# Patient Record
Sex: Male | Born: 1960 | Race: White | Hispanic: No | Marital: Married | Smoking: Former smoker
Health system: Southern US, Community
[De-identification: ages and names within clinical notes are randomized; demographics above are authoritative.]

## PROBLEM LIST (undated history)

## (undated) DIAGNOSIS — E785 Hyperlipidemia, unspecified: Secondary | ICD-10-CM

## (undated) DIAGNOSIS — G40909 Epilepsy, unspecified, not intractable, without status epilepticus: Secondary | ICD-10-CM

## (undated) HISTORY — PX: KNEE SURGERY: SHX244

## (undated) HISTORY — DX: Epilepsy, unspecified, not intractable, without status epilepticus: G40.909

---

## 1999-01-26 ENCOUNTER — Ambulatory Visit (HOSPITAL_COMMUNITY): Admission: RE | Admit: 1999-01-26 | Discharge: 1999-01-26 | Payer: Self-pay | Admitting: Internal Medicine

## 1999-01-26 ENCOUNTER — Encounter: Payer: Self-pay | Admitting: Internal Medicine

## 1999-12-04 ENCOUNTER — Emergency Department (HOSPITAL_COMMUNITY): Admission: EM | Admit: 1999-12-04 | Discharge: 1999-12-04 | Payer: Self-pay | Admitting: *Deleted

## 2003-03-11 ENCOUNTER — Encounter: Payer: Self-pay | Admitting: Family Medicine

## 2003-03-11 ENCOUNTER — Ambulatory Visit (HOSPITAL_COMMUNITY): Admission: RE | Admit: 2003-03-11 | Discharge: 2003-03-11 | Payer: Self-pay | Admitting: Family Medicine

## 2013-03-19 DIAGNOSIS — G40209 Localization-related (focal) (partial) symptomatic epilepsy and epileptic syndromes with complex partial seizures, not intractable, without status epilepticus: Secondary | ICD-10-CM | POA: Insufficient documentation

## 2013-11-06 ENCOUNTER — Ambulatory Visit: Payer: No Typology Code available for payment source

## 2013-11-06 ENCOUNTER — Ambulatory Visit (INDEPENDENT_AMBULATORY_CARE_PROVIDER_SITE_OTHER): Payer: No Typology Code available for payment source | Admitting: Family Medicine

## 2013-11-06 VITALS — BP 118/74 | HR 76 | Temp 98.1°F | Resp 16 | Ht 72.0 in | Wt 241.0 lb

## 2013-11-06 DIAGNOSIS — R109 Unspecified abdominal pain: Secondary | ICD-10-CM

## 2013-11-06 DIAGNOSIS — IMO0002 Reserved for concepts with insufficient information to code with codable children: Secondary | ICD-10-CM

## 2013-11-06 DIAGNOSIS — S39011A Strain of muscle, fascia and tendon of abdomen, initial encounter: Secondary | ICD-10-CM

## 2013-11-06 DIAGNOSIS — R252 Cramp and spasm: Secondary | ICD-10-CM

## 2013-11-06 DIAGNOSIS — R290 Tetany: Secondary | ICD-10-CM

## 2013-11-06 LAB — POCT UA - MICROSCOPIC ONLY
BACTERIA, U MICROSCOPIC: NEGATIVE
Casts, Ur, LPF, POC: NEGATIVE
Crystals, Ur, HPF, POC: NEGATIVE
Yeast, UA: NEGATIVE

## 2013-11-06 LAB — POCT URINALYSIS DIPSTICK
Bilirubin, UA: NEGATIVE
Blood, UA: NEGATIVE
Glucose, UA: NEGATIVE
Ketones, UA: 15
Leukocytes, UA: NEGATIVE
Nitrite, UA: NEGATIVE
Protein, UA: NEGATIVE
Spec Grav, UA: >=1.03
Urobilinogen, UA: 1
pH, UA: 5.5

## 2013-11-06 LAB — POCT CBC
Granulocyte percent: 49.6 %G (ref 37–80)
HCT, POC: 46.2 % (ref 43.5–53.7)
Hemoglobin: 15 g/dL (ref 14.1–18.1)
Lymph, poc: 3 (ref 0.6–3.4)
MCH, POC: 31.6 pg — AB (ref 27–31.2)
MCHC: 32.5 g/dL (ref 31.8–35.4)
MCV: 97.4 fL — AB (ref 80–97)
MID (cbc): 0.4 (ref 0–0.9)
MPV: 9.1 fL (ref 0–99.8)
POC Granulocyte: 3.4 (ref 2–6.9)
POC LYMPH PERCENT: 44.2 %L (ref 10–50)
POC MID %: 6.2 %M (ref 0–12)
Platelet Count, POC: 276 10*3/uL (ref 142–424)
RBC: 4.74 M/uL (ref 4.69–6.13)
RDW, POC: 13.1 %
WBC: 6.9 10*3/uL (ref 4.6–10.2)

## 2013-11-06 MED ORDER — CYCLOBENZAPRINE HCL 10 MG PO TABS: 10.0000 mg | ORAL_TABLET | Freq: Three times a day (TID) | ORAL | Status: DC | PRN

## 2013-11-06 MED ORDER — DICLOFENAC SODIUM 75 MG PO TBEC: 75.0000 mg | DELAYED_RELEASE_TABLET | Freq: Two times a day (BID) | ORAL | Status: DC

## 2013-11-06 NOTE — Patient Instructions (Signed)
Take the diclofenac one twice a day for pain and inflammation  Take the cyclobenzaprine one at bedtime for muscle accident. When you're not working you can take one half or one in the morning and in the afternoon if needed. It probably makes her too drowsy to make you want to work with it in your system  Hydrate better  A light you know the results of your labs  If symptoms persist we will need to do other studies and/or refer you for physical therapy..Marland Kitchen

## 2013-11-06 NOTE — Progress Notes (Signed)
Subjective: Patient has been having pain in his flank off and on for the past year. Sometimes it hurts worse, sometimes less. It does seem to be somewhat activity related. Knows of no initial injury. It is hurt a lot yesterday and today. No nausea vomiting. Assessment chronic right quadrant tenderness. He does have an intermittent burning in his epigastrium which he's had for many years.  Generally he is healthy. He has not been exercising as much over the last years he used to. He has gradually gained 30 pounds over the recent years.  Patient does complain of occasionally getting carpal spasm in his hands. Apparently it happens when he is dehydrated, and he does not drink enough water. We talked about that. Objective: No acute distress. No CVA tenderness. Chest clear. Abdomen soft without masses or tenderness.    Results for orders placed in visit on 11/06/13  POCT CBC      Result Value Ref Range   WBC 6.9  4.6 - 10.2 K/uL   Lymph, poc 3.0  0.6 - 3.4   POC LYMPH PERCENT 44.2  10 - 50 %L   MID (cbc) 0.4  0 - 0.9   POC MID % 6.2  0 - 12 %M   POC Granulocyte 3.4  2 - 6.9   Granulocyte percent 49.6  37 - 80 %G   RBC 4.74  4.69 - 6.13 M/uL   Hemoglobin 15.0  14.1 - 18.1 g/dL   HCT, POC 40.946.2  81.143.5 - 53.7 %   MCV 97.4 (*) 80 - 97 fL   MCH, POC 31.6 (*) 27 - 31.2 pg   MCHC 32.5  31.8 - 35.4 g/dL   RDW, POC 91.413.1     Platelet Count, POC 276  142 - 424 K/uL   MPV 9.1  0 - 99.8 fL  POCT URINALYSIS DIPSTICK      Result Value Ref Range   Color, UA yellow     Clarity, UA clear     Glucose, UA neg     Bilirubin, UA neg     Ketones, UA 15     Spec Grav, UA >=1.030     Blood, UA neg     pH, UA 5.5     Protein, UA neg     Urobilinogen, UA 1.0     Nitrite, UA neg     Leukocytes, UA Negative    POCT UA - MICROSCOPIC ONLY      Result Value Ref Range   WBC, Ur, HPF, POC 0-1     RBC, urine, microscopic 0-1     Bacteria, U Microscopic neg     Mucus, UA small     Epithelial cells, urine per  micros 0-1     Crystals, Ur, HPF, POC neg     Casts, Ur, LPF, POC neg     Yeast, UA neg     [facility] reading (PRIMARY) by  Dr. Alwyn RenHopper Normal abdomen.  Assessment: Right flank pain etiology and known, probable muscle strain Intermittent carpal spasm

## 2013-11-07 LAB — COMPREHENSIVE METABOLIC PANEL
ALT: 19 U/L (ref 0–53)
AST: 21 U/L (ref 0–37)
Albumin: 4.4 g/dL (ref 3.5–5.2)
Alkaline Phosphatase: 64 U/L (ref 39–117)
BUN: 19 mg/dL (ref 6–23)
CO2: 25 mEq/L (ref 19–32)
Calcium: 9.4 mg/dL (ref 8.4–10.5)
Chloride: 101 mEq/L (ref 96–112)
Creat: 1.35 mg/dL (ref 0.50–1.35)
Glucose, Bld: 92 mg/dL (ref 70–99)
Potassium: 4.5 mEq/L (ref 3.5–5.3)
Sodium: 140 mEq/L (ref 135–145)
Total Bilirubin: 0.3 mg/dL (ref 0.2–1.2)
Total Protein: 7.3 g/dL (ref 6.0–8.3)

## 2013-11-08 ENCOUNTER — Other Ambulatory Visit: Payer: Self-pay | Admitting: Family Medicine

## 2013-11-09 ENCOUNTER — Encounter: Payer: Self-pay | Admitting: *Deleted

## 2019-09-04 ENCOUNTER — Other Ambulatory Visit: Payer: Self-pay | Admitting: Urology

## 2019-09-04 DIAGNOSIS — N218 Other lower urinary tract calculus: Secondary | ICD-10-CM

## 2019-09-09 ENCOUNTER — Other Ambulatory Visit: Payer: Self-pay | Admitting: Urology

## 2019-09-09 DIAGNOSIS — N218 Other lower urinary tract calculus: Secondary | ICD-10-CM

## 2019-09-09 DIAGNOSIS — R109 Unspecified abdominal pain: Secondary | ICD-10-CM

## 2019-09-09 DIAGNOSIS — R310 Gross hematuria: Secondary | ICD-10-CM

## 2019-09-11 ENCOUNTER — Ambulatory Visit
Admission: RE | Admit: 2019-09-11 | Discharge: 2019-09-11 | Disposition: A | Discharge status: Home / Self Care | Payer: BC Managed Care – PPO | Source: Ambulatory Visit | Attending: Urology | Admitting: Urology

## 2019-09-11 DIAGNOSIS — N218 Other lower urinary tract calculus: Secondary | ICD-10-CM

## 2019-09-11 DIAGNOSIS — R109 Unspecified abdominal pain: Secondary | ICD-10-CM

## 2019-09-11 DIAGNOSIS — R310 Gross hematuria: Secondary | ICD-10-CM

## 2019-09-11 MED ORDER — IOPAMIDOL (ISOVUE-300) INJECTION 61%
125.0000 mL | Freq: Once | INTRAVENOUS | Status: AC | PRN
  Administered 2019-09-11: 13:00:00 125 mL via INTRAVENOUS

## 2019-11-07 ENCOUNTER — Inpatient Hospital Stay (HOSPITAL_COMMUNITY)
Admission: EM | Admit: 2019-11-07 | Discharge: 2019-11-09 | DRG: 177 | Disposition: A | Discharge status: Home / Self Care | Payer: BC Managed Care – PPO | Attending: Internal Medicine | Admitting: Internal Medicine

## 2019-11-07 ENCOUNTER — Encounter (HOSPITAL_COMMUNITY): Payer: Self-pay | Admitting: Emergency Medicine

## 2019-11-07 ENCOUNTER — Emergency Department (HOSPITAL_COMMUNITY): Payer: BC Managed Care – PPO

## 2019-11-07 ENCOUNTER — Other Ambulatory Visit: Payer: Self-pay

## 2019-11-07 DIAGNOSIS — Z8249 Family history of ischemic heart disease and other diseases of the circulatory system: Secondary | ICD-10-CM

## 2019-11-07 DIAGNOSIS — R059 Cough, unspecified: Secondary | ICD-10-CM

## 2019-11-07 DIAGNOSIS — U071 COVID-19: Principal | ICD-10-CM | POA: Diagnosis present

## 2019-11-07 DIAGNOSIS — R55 Syncope and collapse: Secondary | ICD-10-CM | POA: Diagnosis present

## 2019-11-07 DIAGNOSIS — R6883 Chills (without fever): Secondary | ICD-10-CM

## 2019-11-07 DIAGNOSIS — J1282 Pneumonia due to coronavirus disease 2019: Secondary | ICD-10-CM | POA: Diagnosis present

## 2019-11-07 DIAGNOSIS — Z87891 Personal history of nicotine dependence: Secondary | ICD-10-CM

## 2019-11-07 DIAGNOSIS — G40909 Epilepsy, unspecified, not intractable, without status epilepticus: Secondary | ICD-10-CM | POA: Diagnosis present

## 2019-11-07 DIAGNOSIS — R0602 Shortness of breath: Secondary | ICD-10-CM | POA: Diagnosis not present

## 2019-11-07 DIAGNOSIS — Z83438 Family history of other disorder of lipoprotein metabolism and other lipidemia: Secondary | ICD-10-CM

## 2019-11-07 DIAGNOSIS — R05 Cough: Secondary | ICD-10-CM

## 2019-11-07 DIAGNOSIS — J189 Pneumonia, unspecified organism: Secondary | ICD-10-CM

## 2019-11-07 DIAGNOSIS — R079 Chest pain, unspecified: Secondary | ICD-10-CM

## 2019-11-07 LAB — POCT I-STAT 7, (LYTES, BLD GAS, ICA,H+H)
Bicarbonate: 24.1 mmol/L (ref 20.0–28.0)
Calcium, Ion: 1.19 mmol/L (ref 1.15–1.40)
HCT: 38 % — ABNORMAL LOW (ref 39.0–52.0)
Hemoglobin: 12.9 g/dL — ABNORMAL LOW (ref 13.0–17.0)
O2 Saturation: 94 %
Patient temperature: 99.5
Potassium: 3.8 mmol/L (ref 3.5–5.1)
Sodium: 139 mmol/L (ref 135–145)
TCO2: 25 mmol/L (ref 22–32)
pCO2 arterial: 37.1 mmHg (ref 32.0–48.0)
pH, Arterial: 7.424 (ref 7.350–7.450)
pO2, Arterial: 69 mmHg — ABNORMAL LOW (ref 83.0–108.0)

## 2019-11-07 LAB — BRAIN NATRIURETIC PEPTIDE: B Natriuretic Peptide: 18.6 pg/mL (ref 0.0–100.0)

## 2019-11-07 LAB — C-REACTIVE PROTEIN: CRP: 1.7 mg/dL — ABNORMAL HIGH (ref <1.0)

## 2019-11-07 LAB — BASIC METABOLIC PANEL
Anion gap: 11 (ref 5–15)
BUN: 14 mg/dL (ref 6–20)
CO2: 24 mmol/L (ref 22–32)
Calcium: 9 mg/dL (ref 8.9–10.3)
Chloride: 105 mmol/L (ref 98–111)
Creatinine, Ser: 1.05 mg/dL (ref 0.61–1.24)
GFR calc Af Amer: >60 mL/min (ref >60)
GFR calc non Af Amer: >60 mL/min (ref >60)
Glucose, Bld: 96 mg/dL (ref 70–99)
Potassium: 4.3 mmol/L (ref 3.5–5.1)
Sodium: 140 mmol/L (ref 135–145)

## 2019-11-07 LAB — CBC
HCT: 47 % (ref 39.0–52.0)
Hemoglobin: 15.1 g/dL (ref 13.0–17.0)
MCH: 31.7 pg (ref 26.0–34.0)
MCHC: 32.1 g/dL (ref 30.0–36.0)
MCV: 98.7 fL (ref 80.0–100.0)
Platelets: 94 10*3/uL — ABNORMAL LOW (ref 150–400)
RBC: 4.76 MIL/uL (ref 4.22–5.81)
RDW: 12.5 % (ref 11.5–15.5)
WBC: 3.3 10*3/uL — ABNORMAL LOW (ref 4.0–10.5)
nRBC: 0 % (ref 0.0–0.2)

## 2019-11-07 LAB — MAGNESIUM: Magnesium: 1.7 mg/dL (ref 1.7–2.4)

## 2019-11-07 LAB — LACTIC ACID, PLASMA: Lactic Acid, Venous: 1 mmol/L (ref 0.5–1.9)

## 2019-11-07 LAB — HEPATIC FUNCTION PANEL
ALT: 34 U/L (ref 0–44)
AST: 33 U/L (ref 15–41)
Albumin: 3.2 g/dL — ABNORMAL LOW (ref 3.5–5.0)
Alkaline Phosphatase: 60 U/L (ref 38–126)
Bilirubin, Direct: 0.1 mg/dL (ref 0.0–0.2)
Indirect Bilirubin: 0.3 mg/dL (ref 0.3–0.9)
Total Bilirubin: 0.4 mg/dL (ref 0.3–1.2)
Total Protein: 6.8 g/dL (ref 6.5–8.1)

## 2019-11-07 LAB — D-DIMER, QUANTITATIVE: D-Dimer, Quant: 0.65 ug/mL-FEU — ABNORMAL HIGH (ref 0.00–0.50)

## 2019-11-07 LAB — LACTATE DEHYDROGENASE: LDH: 230 U/L — ABNORMAL HIGH (ref 98–192)

## 2019-11-07 LAB — POC SARS CORONAVIRUS 2 AG -  ED: SARS Coronavirus 2 Ag: POSITIVE — AB

## 2019-11-07 LAB — LIPASE, BLOOD: Lipase: 41 U/L (ref 11–51)

## 2019-11-07 LAB — TROPONIN I (HIGH SENSITIVITY)
Troponin I (High Sensitivity): 6 ng/L (ref <18)
Troponin I (High Sensitivity): 7 ng/L (ref <18)

## 2019-11-07 MED ORDER — SODIUM CHLORIDE 0.9 % IV SOLN: 1.0000 g | Freq: Once | INTRAVENOUS | Status: DC

## 2019-11-07 MED ORDER — SODIUM CHLORIDE 0.9 % IV SOLN: 500.0000 mg | Freq: Once | INTRAVENOUS | Status: DC

## 2019-11-07 MED ORDER — IOHEXOL 350 MG/ML SOLN
100.0000 mL | Freq: Once | INTRAVENOUS | Status: AC | PRN
  Administered 2019-11-07: 100 mL via INTRAVENOUS

## 2019-11-07 MED ORDER — SODIUM CHLORIDE 0.9% FLUSH
3.0000 mL | Freq: Once | INTRAVENOUS | Status: AC
  Administered 2019-11-07: 3 mL via INTRAVENOUS

## 2019-11-07 MED ORDER — SODIUM CHLORIDE 0.9 % IV BOLUS
1000.0000 mL | Freq: Once | INTRAVENOUS | Status: AC
  Administered 2019-11-07: 1000 mL via INTRAVENOUS

## 2019-11-07 NOTE — ED Notes (Signed)
Pt ambulated on his room with steady gait, SPO2 96-98% on RA while ambulating with HR 110, pt states he feels very dizzy on ambulation.

## 2019-11-07 NOTE — ED Notes (Signed)
Only one set of Blood cx gotten, attempted to get second set twice no success. Kurin device used with aseptic measures, pt tolerated procedure well.

## 2019-11-07 NOTE — ED Notes (Signed)
Brett Rhodes, wife, [phone] would like an update when available

## 2019-11-07 NOTE — ED Provider Notes (Signed)
[facility] Provider Note   CSN: [id number] Arrival date & time: 11/07/19  1359     History Chief Complaint  Brett Rhodes presents with  . Chest Pain    Brett Rhodes is a 59 y.o. male.  The history is provided by the Brett Rhodes and medical records. No language interpreter was used.  Shortness of Breath Severity:  Severe Onset quality:  Gradual Duration:  3 days Timing:  Constant Progression:  Waxing and waning Chronicity:  New Relieved by:  Nothing Worsened by:  Nothing Ineffective treatments:  None tried Associated symptoms: chest pain, cough and sputum production   Associated symptoms: no abdominal pain, no claudication, no diaphoresis, no fever, no headaches, no neck pain, no rash, no vomiting and no wheezing        Past Medical History:  Diagnosis Date  . Epilepsy (HCC)     There are no problems to display for this Brett Rhodes.   Past Surgical History:  Procedure Laterality Date  . KNEE SURGERY         Family History  Problem Relation Age of Onset  . Hypertension Mother   . Hyperlipidemia Mother   . Hyperlipidemia Father     Social History   Tobacco Use  . Smoking status: Former Games developermoker  . Smokeless tobacco: Never Used  Substance Use Topics  . Alcohol use: Not Currently  . Drug use: Not Currently    Home Medications Prior to Admission medications   Medication Sig Start Date End Date Taking? Authorizing Provider  cyclobenzaprine (FLEXERIL) 10 MG tablet Take 1 tablet (10 mg total) by mouth 3 (three) times daily as needed for muscle spasms. 11/06/13   Peyton NajjarHopper, David H, MD  diclofenac (VOLTAREN) 75 MG EC tablet Take 1 tablet (75 mg total) by mouth 2 (two) times daily. 11/06/13   Peyton NajjarHopper, David H, MD  divalproex (DEPAKOTE ER) 500 MG 24 hr tablet Take 500 mg by mouth daily.    [provider]    Allergies    Brett Rhodes has no known allergies.  Review of Systems   Review of Systems  Constitutional: Positive  for chills and fatigue. Negative for diaphoresis and fever.  HENT: Positive for congestion.   Eyes: Negative for visual disturbance.  Respiratory: Positive for cough, sputum production, chest tightness and shortness of breath. Negative for wheezing and stridor.   Cardiovascular: Positive for chest pain. Negative for palpitations, claudication and leg swelling.  Gastrointestinal: Negative for abdominal pain, constipation, diarrhea, nausea and vomiting.  Genitourinary: Negative for flank pain.  Musculoskeletal: Negative for back pain, neck pain and neck stiffness.  Skin: Negative for rash and wound.  Neurological: Negative for dizziness, light-headedness, numbness and headaches.  Psychiatric/Behavioral: Negative for agitation and confusion.  All other systems reviewed and are negative.   Physical Exam Updated Vital Signs BP (!) 166/91   Pulse 83   Temp 99 F (37.2 C) (Oral)   Resp 16   Ht 6\' 1"  (1.854 m)   Wt 111.1 kg   SpO2 96%   BMI 32.32 kg/m   Physical Exam Vitals and nursing note reviewed.  Constitutional:      General: Brett Rhodes is not in acute distress.    Appearance: Brett Rhodes is well-developed. Brett Rhodes is not toxic-appearing or diaphoretic.  HENT:     Head: Normocephalic and atraumatic.  Eyes:     Extraocular Movements: Extraocular movements intact.     Conjunctiva/sclera: Conjunctivae normal.     Pupils: Pupils are equal, round, and reactive  to light.  Cardiovascular:     Rate and Rhythm: Normal rate and regular rhythm.     Heart sounds: No murmur.  Pulmonary:     Effort: Pulmonary effort is normal. Tachypnea present. No respiratory distress.     Breath sounds: Rhonchi present. No wheezing or rales.  Abdominal:     Palpations: Abdomen is soft.     Tenderness: There is no abdominal tenderness.  Musculoskeletal:     Cervical back: Neck supple.     Right lower leg: No tenderness. No edema.     Left lower leg: No tenderness. No edema.  Skin:    General: Skin is warm and dry.    Neurological:     General: No focal deficit present.     Mental Status: Brett Rhodes is alert and oriented to person, place, and time.  Psychiatric:        Mood and Affect: Mood normal.     ED Results / Procedures / Treatments   Labs (all labs ordered are listed, but only abnormal results are displayed) Labs Reviewed  CBC - Abnormal; Notable for the following components:      Result Value   WBC 3.3 (*)    Platelets 94 (*)    All other components within normal limits  HEPATIC FUNCTION PANEL - Abnormal; Notable for the following components:   Albumin 3.2 (*)    All other components within normal limits  C-REACTIVE PROTEIN - Abnormal; Notable for the following components:   CRP 1.7 (*)    All other components within normal limits  LACTATE DEHYDROGENASE - Abnormal; Notable for the following components:   LDH 230 (*)    All other components within normal limits  D-DIMER, QUANTITATIVE (NOT AT [facility]) - Abnormal; Notable for the following components:   D-Dimer, Quant 0.65 (*)    All other components within normal limits  POC SARS CORONAVIRUS 2 AG -  ED - Abnormal; Notable for the following components:   SARS Coronavirus 2 Ag POSITIVE (*)    All other components within normal limits  POCT I-STAT 7, (LYTES, BLD GAS, ICA,H+H) - Abnormal; Notable for the following components:   pO2, Arterial 69.0 (*)    HCT 38.0 (*)    Hemoglobin 12.9 (*)    All other components within normal limits  SARS CORONAVIRUS 2 (TAT 6-24 HRS)  CULTURE, BLOOD (ROUTINE X 2)  CULTURE, BLOOD (ROUTINE X 2)  BASIC METABOLIC PANEL  BRAIN NATRIURETIC PEPTIDE  MAGNESIUM  LIPASE, BLOOD  LACTIC ACID, PLASMA  BLOOD GAS, ARTERIAL  TROPONIN I (HIGH SENSITIVITY)  TROPONIN I (HIGH SENSITIVITY)    EKG EKG Interpretation  Date/Time:  Saturday November 07 2019 14:04:26 EDT Ventricular Rate:  98 PR Interval:  144 QRS Duration: 92 QT Interval:  340 QTC Calculation: 434 R Axis:   -28 Text Interpretation: Normal sinus rhythm  Incomplete right bundle branch block Borderline ECG No prior ECG for comparison. S1Q3T3 pattern. No STEMI Confirmed by Theda Belfast ([id number]) on 11/07/2019 5:50:07 PM   Radiology DG Chest 2 View  Result Date: 11/07/2019 CLINICAL DATA:  Shortness of breath. EXAM: CHEST - 2 VIEW COMPARISON:  None. FINDINGS: The heart size and mediastinal contours are within normal limits. No pneumothorax or pleural effusion is noted. Right lung is clear. Ill-defined left upper lobe density is noted which may represent focal inflammation, but neoplasm malignancy cannot be excluded. The visualized skeletal structures are unremarkable. IMPRESSION: Ill-defined left upper lobe density is noted which may represent focal inflammation, but  neoplasm cannot be excluded. CT scan of the chest is recommended for further evaluation. Electronically Signed   By: Lupita Raider M.D.   On: 11/07/2019 14:45   CT Angio Chest PE W and/or Wo Contrast  Result Date: 11/07/2019 CLINICAL DATA:  Chest pain and dyspnea. Abnormal chest radiograph. EXAM: CT ANGIOGRAPHY CHEST WITH CONTRAST TECHNIQUE: Multidetector CT imaging of the chest was performed using the standard protocol during bolus administration of intravenous contrast. Multiplanar CT image reconstructions and MIPs were obtained to evaluate the vascular anatomy. CONTRAST:  OMNIPAQUE IOHEXOL 350 MG/ML SOLN COMPARISON:  Chest radiograph from earlier today. FINDINGS: Cardiovascular: The study is moderate quality for the evaluation of pulmonary embolism, with mild motion degradation and suboptimal bolus opacification. There are no filling defects in the central, lobar, segmental or subsegmental pulmonary artery branches to suggest acute pulmonary embolism. Great vessels are normal in course and caliber. Normal heart size. No significant pericardial fluid/thickening. Mediastinum/Nodes: No discrete thyroid nodules. Unremarkable esophagus. No pathologically enlarged axillary, mediastinal or  hilar lymph nodes. Lungs/Pleura: No pneumothorax. No pleural effusion. Widespread patchy poorly marginated consolidative and ground-glass nodules throughout both lungs, for example measuring 2.8 cm in the left lower lobe (series 7/image 58), 1.9 cm in the apical right upper lobe (series 7/image 20) and 3.8 cm in the left upper lobe (series 7/image 33). Upper abdomen: No acute abnormality. Musculoskeletal: No aggressive appearing focal osseous lesions. Moderate thoracic spondylosis. Review of the MIP images confirms the above findings. IMPRESSION: 1. No evidence of pulmonary embolism. 2. Widespread patchy poorly marginated consolidative and ground-glass nodules throughout both lungs. Atypical pneumonia suspected, with differential including COVID-19 pneumonia or cryptogenic organizing pneumonia. Recommend follow-up chest CT in 3 months to document resolution / exclude neoplasm. Electronically Signed   By: Delbert Phenix M.D.   On: 11/07/2019 18:45    Procedures Procedures (including critical care time)  CRITICAL CARE Performed by: Canary Brim Julus Kelley Total critical care time: 35 minutes Critical care time was exclusive of separately billable procedures and treating other patients. Critical care was necessary to treat or prevent imminent or life-threatening deterioration. Critical care was time spent personally by me on the following activities: development of treatment plan with Brett Rhodes and/or surrogate as well as nursing, discussions with consultants, evaluation of Brett Rhodes's response to treatment, examination of Brett Rhodes, obtaining history from Brett Rhodes or surrogate, ordering and performing treatments and interventions, ordering and review of laboratory studies, ordering and review of radiographic studies, pulse oximetry and re-evaluation of Brett Rhodes's condition.  Brett Rhodes was evaluated in Emergency Department on 11/08/2019 for the symptoms described in the history of present illness. Brett Rhodes was  evaluated in the context of the global COVID-19 pandemic, which necessitated consideration that the Brett Rhodes might be at risk for infection with the SARS-CoV-2 virus that causes COVID-19. Institutional protocols and algorithms that pertain to the evaluation of patients at risk for COVID-19 are in a state of rapid change based on information released by regulatory bodies including the CDC and federal and state organizations. These policies and algorithms were followed during the Brett Rhodes's care in the ED.  Medications Ordered in ED Medications  divalproex (DEPAKOTE) DR tablet 1,000 mg (1,000 mg Oral Given 11/08/19 0108)  dexamethasone (DECADRON) injection 6 mg (6 mg Intravenous Given 11/08/19 0106)  albuterol (VENTOLIN HFA) 108 (90 Base) MCG/ACT inhaler 2 puff (has no administration in time range)  lactated ringers infusion ( Intravenous New Bag/Given 11/08/19 0107)  remdesivir 200 mg in sodium chloride 0.9% 250 mL IVPB (has  no administration in time range)  remdesivir 100 mg in sodium chloride 0.9 % 100 mL IVPB (has no administration in time range)  sodium chloride flush (NS) 0.9 % injection 3 mL (3 mLs Intravenous Given 11/07/19 1729)  sodium chloride 0.9 % bolus 1,000 mL (0 mLs Intravenous Stopped 11/07/19 1900)  iohexol (OMNIPAQUE) 350 MG/ML injection 100 mL (100 mLs Intravenous Contrast Given 11/07/19 1815)    ED Course  I have reviewed the triage vital signs and the nursing notes.  Pertinent labs & imaging results that were available during my care of the Brett Rhodes were reviewed by me and considered in my medical decision making (see chart for details).    Brett Rhodes                      MARQUIZE SEIB is a 59 y.o. male with a past medical history significant for epilepsy who presents with chest pain, shortness of breath, cough, chills, and near syncope/lightheadedness.  Brett Rhodes reports that Brett Rhodes received the [place] vaccine approximately 9 days ago and is concerned  about chest pain or shortness of breath Brett Rhodes is experiencing.  Brett Rhodes reports Brett Rhodes had some subjective fevers and chills but has not taken temperature at home.  Brett Rhodes was going to have his son taken for similar symptoms to get evaluated with a coronavirus test but when Brett Rhodes started having worsening chest pain and shortness of breath Brett Rhodes want to come in to make sure Brett Rhodes did not have a pulmonary embolism given the concern in the community for thromboembolic disease with the [employer] shot.  Brett Rhodes says Brett Rhodes is having exertional and pleuritic chest discomfort and shortness of breath and is having cough.  Brett Rhodes denies nausea, vomiting, constipation, or diarrhea.  No recent leg pain or leg swelling.  No history of DVT or PE.  On exam, Brett Rhodes does have some rhonchi.  His chest was nontender and abdomen is nontender.  Normal sensation and strength in extremities.  Good pulses in all extremities.  Brett Rhodes did get tachycardic when Brett Rhodes tried to stand and was very short of breath and lightheaded.  Brett Rhodes was near syncopal when Brett Rhodes sat up to let me listen to his lungs.  EKG showed no STEMI.  Chest x-ray showed abnormality and possible pneumonia.  CT was recommended.  Due to the concern for pulmonary embolism with a pleuritic chest pain, shortness of breath, and recent Covid shot, Brett Rhodes had a PE study ordered.  CT PE study shows no pulmonary ballismus though shows multifocal pneumonia concerning for atypical infection.  Covid test was ordered and returned positive.  Initially, antibiotics were ordered due to the possible multifocal pneumonia and due to his continued shortness of breath, chest discomfort, and worsened symptoms with ambulation with tachycardia, we will admit him for further management.  Brett Rhodes placed on oxygen and will be admitted.  Brett Rhodes admitted for shortness of breath and chest discomfort in the setting of COVID-19 infection and possible other pneumonia.     Final Clinical Impression(s) / ED Diagnoses Final  diagnoses:  Chest pain, unspecified type  Cough  Chills  Shortness of breath  Near syncope  Multifocal pneumonia     Clinical Impression: 1. Multifocal pneumonia   2. Chest pain, unspecified type   3. Cough   4. Chills   5. Shortness of breath   6. Near syncope     Disposition: Admit  This note was prepared with assistance of Systems analyst.  Occasional wrong-word or sound-a-like substitutions may have occurred due to the inherent limitations of voice recognition software.     Dnaiel Voller, Canary Brim, MD 11/08/19 [phone]

## 2019-11-07 NOTE — ED Notes (Signed)
Pt ambulated around the room, oxygen saturation remained at 96-98% but pt became very dizzy and could not walk much around the room due to the dizziness. No resp distress noted.

## 2019-11-07 NOTE — Progress Notes (Signed)
ABG done and added 2L O2 to patients at this time. SATs on ABG correlated with SATs on monitor. Increased after O2 applied from 94 to 97-98%

## 2019-11-07 NOTE — ED Notes (Signed)
Pt to CT

## 2019-11-07 NOTE — ED Triage Notes (Signed)
Pt to triage via [facility] from home.  Received [employer] COVID Vaccine on 4/8.  Reports malaise since then.  C/o SOB on exertion and sore throat x 1 week.  Intermittent chest pain started today.  EMS administered ASA.  Pt denies CP on arrival but reports SOB and dizziness.

## 2019-11-08 ENCOUNTER — Encounter (HOSPITAL_COMMUNITY): Payer: Self-pay | Admitting: Internal Medicine

## 2019-11-08 DIAGNOSIS — J1282 Pneumonia due to coronavirus disease 2019: Secondary | ICD-10-CM

## 2019-11-08 DIAGNOSIS — Z8249 Family history of ischemic heart disease and other diseases of the circulatory system: Secondary | ICD-10-CM | POA: Diagnosis not present

## 2019-11-08 DIAGNOSIS — G40909 Epilepsy, unspecified, not intractable, without status epilepticus: Secondary | ICD-10-CM | POA: Diagnosis present

## 2019-11-08 DIAGNOSIS — R55 Syncope and collapse: Secondary | ICD-10-CM | POA: Diagnosis present

## 2019-11-08 DIAGNOSIS — Z87891 Personal history of nicotine dependence: Secondary | ICD-10-CM | POA: Diagnosis not present

## 2019-11-08 DIAGNOSIS — U071 COVID-19: Principal | ICD-10-CM

## 2019-11-08 DIAGNOSIS — R0602 Shortness of breath: Secondary | ICD-10-CM | POA: Diagnosis present

## 2019-11-08 DIAGNOSIS — Z83438 Family history of other disorder of lipoprotein metabolism and other lipidemia: Secondary | ICD-10-CM | POA: Diagnosis not present

## 2019-11-08 LAB — CBC WITH DIFFERENTIAL/PLATELET
Abs Immature Granulocytes: 0.01 10*3/uL (ref 0.00–0.07)
Basophils Absolute: 0 10*3/uL (ref 0.0–0.1)
Basophils Relative: 0 %
Eosinophils Absolute: 0 10*3/uL (ref 0.0–0.5)
Eosinophils Relative: 0 %
HCT: 40.9 % (ref 39.0–52.0)
Hemoglobin: 13.6 g/dL (ref 13.0–17.0)
Immature Granulocytes: 0 %
Lymphocytes Relative: 30 %
Lymphs Abs: 1 10*3/uL (ref 0.7–4.0)
MCH: 32 pg (ref 26.0–34.0)
MCHC: 33.3 g/dL (ref 30.0–36.0)
MCV: 96.2 fL (ref 80.0–100.0)
Monocytes Absolute: 0.1 10*3/uL (ref 0.1–1.0)
Monocytes Relative: 4 %
Neutro Abs: 2.3 10*3/uL (ref 1.7–7.7)
Neutrophils Relative %: 66 %
Platelets: 83 10*3/uL — ABNORMAL LOW (ref 150–400)
RBC: 4.25 MIL/uL (ref 4.22–5.81)
RDW: 12.6 % (ref 11.5–15.5)
WBC: 3.4 10*3/uL — ABNORMAL LOW (ref 4.0–10.5)
nRBC: 0 % (ref 0.0–0.2)

## 2019-11-08 LAB — COMPREHENSIVE METABOLIC PANEL
ALT: 31 U/L (ref 0–44)
AST: 34 U/L (ref 15–41)
Albumin: 2.9 g/dL — ABNORMAL LOW (ref 3.5–5.0)
Alkaline Phosphatase: 52 U/L (ref 38–126)
Anion gap: 11 (ref 5–15)
BUN: 13 mg/dL (ref 6–20)
CO2: 23 mmol/L (ref 22–32)
Calcium: 8.3 mg/dL — ABNORMAL LOW (ref 8.9–10.3)
Chloride: 104 mmol/L (ref 98–111)
Creatinine, Ser: 1.18 mg/dL (ref 0.61–1.24)
GFR calc Af Amer: >60 mL/min (ref >60)
GFR calc non Af Amer: >60 mL/min (ref >60)
Glucose, Bld: 130 mg/dL — ABNORMAL HIGH (ref 70–99)
Potassium: 4 mmol/L (ref 3.5–5.1)
Sodium: 138 mmol/L (ref 135–145)
Total Bilirubin: 0.4 mg/dL (ref 0.3–1.2)
Total Protein: 6.1 g/dL — ABNORMAL LOW (ref 6.5–8.1)

## 2019-11-08 LAB — PHOSPHORUS: Phosphorus: 1.6 mg/dL — ABNORMAL LOW (ref 2.5–4.6)

## 2019-11-08 LAB — ABO/RH: ABO/RH(D): A POS

## 2019-11-08 LAB — C-REACTIVE PROTEIN: CRP: 2.2 mg/dL — ABNORMAL HIGH (ref <1.0)

## 2019-11-08 LAB — SARS CORONAVIRUS 2 (TAT 6-24 HRS): SARS Coronavirus 2: POSITIVE — AB

## 2019-11-08 LAB — HIV ANTIBODY (ROUTINE TESTING W REFLEX): HIV Screen 4th Generation wRfx: NONREACTIVE

## 2019-11-08 LAB — MAGNESIUM: Magnesium: 1.6 mg/dL — ABNORMAL LOW (ref 1.7–2.4)

## 2019-11-08 LAB — FERRITIN: Ferritin: 508 ng/mL — ABNORMAL HIGH (ref 24–336)

## 2019-11-08 MED ORDER — ONDANSETRON HCL 4 MG/2ML IJ SOLN: 4.0000 mg | Freq: Four times a day (QID) | INTRAMUSCULAR | Status: DC | PRN

## 2019-11-08 MED ORDER — GUAIFENESIN-DM 100-10 MG/5ML PO SYRP: 10.0000 mL | ORAL_SOLUTION | ORAL | Status: DC | PRN

## 2019-11-08 MED ORDER — SODIUM CHLORIDE 0.9 % IV SOLN
100.0000 mg | Freq: Every day | INTRAVENOUS | Status: DC
  Administered 2019-11-09: 100 mg via INTRAVENOUS
  Filled 2019-11-08: qty 20

## 2019-11-08 MED ORDER — ONDANSETRON HCL 4 MG PO TABS: 4.0000 mg | ORAL_TABLET | Freq: Four times a day (QID) | ORAL | Status: DC | PRN

## 2019-11-08 MED ORDER — ENOXAPARIN SODIUM 40 MG/0.4ML ~~LOC~~ SOLN
40.0000 mg | Freq: Every day | SUBCUTANEOUS | Status: DC
  Administered 2019-11-08 – 2019-11-09 (×2): 40 mg via SUBCUTANEOUS
  Filled 2019-11-08 (×2): qty 0.4

## 2019-11-08 MED ORDER — DEXAMETHASONE SODIUM PHOSPHATE 10 MG/ML IJ SOLN
6.0000 mg | Freq: Every day | INTRAMUSCULAR | Status: DC
  Administered 2019-11-08 – 2019-11-09 (×2): 6 mg via INTRAVENOUS
  Filled 2019-11-08 (×2): qty 1

## 2019-11-08 MED ORDER — ACETAMINOPHEN 325 MG PO TABS: 650.0000 mg | ORAL_TABLET | Freq: Four times a day (QID) | ORAL | Status: DC | PRN

## 2019-11-08 MED ORDER — LACTATED RINGERS IV SOLN: INTRAVENOUS | Status: AC

## 2019-11-08 MED ORDER — MAGNESIUM SULFATE 2 GM/50ML IV SOLN
2.0000 g | Freq: Once | INTRAVENOUS | Status: AC
  Administered 2019-11-08: 2 g via INTRAVENOUS
  Filled 2019-11-08: qty 50

## 2019-11-08 MED ORDER — BENZONATATE 100 MG PO CAPS
200.0000 mg | ORAL_CAPSULE | Freq: Three times a day (TID) | ORAL | Status: DC
  Administered 2019-11-08 – 2019-11-09 (×4): 200 mg via ORAL
  Filled 2019-11-08 (×4): qty 2

## 2019-11-08 MED ORDER — SODIUM CHLORIDE 0.9 % IV SOLN
200.0000 mg | Freq: Once | INTRAVENOUS | Status: AC
  Administered 2019-11-08: 200 mg via INTRAVENOUS
  Filled 2019-11-08: qty 40

## 2019-11-08 MED ORDER — HYDROCOD POLST-CPM POLST ER 10-8 MG/5ML PO SUER: 5.0000 mL | Freq: Two times a day (BID) | ORAL | Status: DC | PRN

## 2019-11-08 MED ORDER — DIVALPROEX SODIUM 250 MG PO DR TAB
1000.0000 mg | DELAYED_RELEASE_TABLET | Freq: Every day | ORAL | Status: DC
  Administered 2019-11-08 (×2): 1000 mg via ORAL
  Filled 2019-11-08 (×2): qty 4

## 2019-11-08 MED ORDER — ALBUTEROL SULFATE HFA 108 (90 BASE) MCG/ACT IN AERS
2.0000 | INHALATION_SPRAY | RESPIRATORY_TRACT | Status: DC | PRN
  Filled 2019-11-08: qty 6.7

## 2019-11-08 MED ORDER — POLYETHYLENE GLYCOL 3350 17 G PO PACK: 17.0000 g | PACK | Freq: Every day | ORAL | Status: DC | PRN

## 2019-11-08 NOTE — Progress Notes (Signed)
Patient received from the  ED. Patient is alert and oriented x 4. Vital signs are stable. Iv in place and running fluid. Patient is on 02 2L [place]. Skin assessment done with another nurse. No any distress noted. Patient has given instructions about call bell and phone. Bed in low position and call bell in reach.

## 2019-11-08 NOTE — H&P (Signed)
History and Physical    Brett Rhodes QIW:[id number] DOB: May 22, 1961 DOA: 11/07/2019  PCP: Tarri Fuller, MD  Patient coming from: Home   Chief Complaint:  Chief Complaint  Patient presents with  . Chest Pain     HPI:  59 year old male with past medical history of epilepsy who presents to [facility] emergency department with complaints of chest pain and shortness of breath.  Patient explains that approximately 2 weeks ago, just days after he received his [employer] COVID-19 vaccination, he began to experience mild shortness of breath.  Shortness of breath was initially mild in intensity.  Shortness of breath became progressively more severe over the next several days and became severe in intensity.  Shortness of breath is worse with exertion and improved with rest.  Patient symptoms continue to worsen patient began to develop associated nonproductive cough, generalized weakness, muscle aches and poor appetite.  Patient also noted that he developed intermittent low grade fevers with temperatures as high as 100.0 degrees Fahrenheit.  Patient states that his wife and son have been ill with similar symptoms over the same span of time.  Denies any change in taste or smell.  Patient symptoms continue to worsen until he eventually presented to [facility] emergency department for evaluation.  Upon evaluation in the emergency department, CT imaging of the chest reveals bilateral lower lobe patchy infiltrates with evidence of consolidative and groundglass opacities.  COVID-19 rapid antigen testing came back positive to confirm COVID-19 infection.  The hospitalist group was then called to assess the patient for admission the hospital.     Review of Systems: A 10-system review of systems has been performed and all systems are negative with the exception of what is listed in the HPI.   Past Medical History:  Diagnosis Date  . Epilepsy [facility])     Past Surgical  History:  Procedure Laterality Date  . KNEE SURGERY       reports that he has quit smoking. He has never used smokeless tobacco. He reports previous alcohol use. He reports previous drug use.  Allergies  Allergen Reactions  . Prednisone Other (See Comments)    Unable to take prednisone pills,gives really bad stomach pain     Family History  Problem Relation Age of Onset  . Hypertension Mother   . Hyperlipidemia Mother   . Hyperlipidemia Father      Prior to Admission medications   Medication Sig Start Date End Date Taking? Authorizing Provider  divalproex (DEPAKOTE ER) 500 MG 24 hr tablet Take 1,000 mg by mouth at bedtime.    Yes [provider]  ibuprofen (ADVIL) 200 MG tablet Take 400 mg by mouth every 6 (six) hours as needed for mild pain.   Yes [provider]  Multiple Vitamin (MULTIVITAMIN WITH MINERALS) TABS tablet Take 1 tablet by mouth daily.   Yes [provider]  cyclobenzaprine (FLEXERIL) 10 MG tablet Take 1 tablet (10 mg total) by mouth 3 (three) times daily as needed for muscle spasms. Patient not taking: Reported on 11/07/2019 11/06/13   Peyton Najjar, MD  diclofenac (VOLTAREN) 75 MG EC tablet Take 1 tablet (75 mg total) by mouth 2 (two) times daily. Patient not taking: Reported on 11/07/2019 11/06/13   Peyton Najjar, MD    Physical Exam: Vitals:   11/07/19 1900 11/07/19 2145 11/07/19 2200 11/07/19 2254  BP: 140/80  (!) 141/97   Pulse:  81 78 85  Resp: 18 20 20  20  Temp:      TempSrc:   Oral   SpO2:  94%  97%  Weight:      Height:        Constitutional: Lethargic but arousable and oriented x3.  Patient is tachypneic and in mild respiratory distress.  Patient is obese.   Skin: no rashes, no lesions, notable poor skin turgor. Eyes: Pupils are equally reactive to light.  No evidence of scleral icterus or conjunctival pallor.  ENMT: Mucous membranes are moist. Posterior pharynx clear of any exudate or lesions. Normal dentition.     Neck: normal, supple, no masses, no thyromegaly Respiratory: Normal bibasilar and mid field rales.  No evidence of associated wheezing. Nomal respiratory effort. No accessory muscle use.  Cardiovascular: Regular rate and rhythm, no murmurs / rubs / gallops. No extremity edema. 2+ pedal pulses. No carotid bruits.  Back:   Nontender without crepitus or deformity. Abdomen: Abdomen is protuberant, soft and nontender.  No evidence of intra-abdominal masses.  Positive bowel sounds noted in all quadrants.   Musculoskeletal: No joint deformity upper and lower extremities. Good ROM, no contractures. Normal muscle tone.  Neurologic: CN 2-12 grossly intact. Sensation intact, strength noted to be 5 out of 5 in all 4 extremities.  Patient is following all commands.  Patient is responsive to verbal stimuli.   Psychiatric: Patient presents as a normal mood with appropriate affect.  Patient seems to possess insight as to theircurrent situation.     Labs on Admission: I have personally reviewed following labs and imaging studies -   CBC: Recent Labs  Lab 11/07/19 1418 11/07/19 2245  WBC 3.3*  --   HGB 15.1 12.9*  HCT 47.0 38.0*  MCV 98.7  --   PLT 94*  --    Basic Metabolic Panel: Recent Labs  Lab 11/07/19 1418 11/07/19 1805 11/07/19 2245  NA 140  --  139  K 4.3  --  3.8  CL 105  --   --   CO2 24  --   --   GLUCOSE 96  --   --   BUN 14  --   --   CREATININE 1.05  --   --   CALCIUM 9.0  --   --   MG  --  1.7  --    GFR: Estimated Creatinine Clearance: 102.7 mL/min (by C-G formula based on SCr of 1.05 mg/dL). Liver Function Tests: Recent Labs  Lab 11/07/19 1805  AST 33  ALT 34  ALKPHOS 60  BILITOT 0.4  PROT 6.8  ALBUMIN 3.2*   Recent Labs  Lab 11/07/19 1805  LIPASE 41   No results for input(s): AMMONIA in the last 168 hours. Coagulation Profile: No results for input(s): INR, PROTIME in the last 168 hours. Cardiac Enzymes: No results for input(s): CKTOTAL, CKMB, CKMBINDEX,  TROPONINI in the last 168 hours. BNP (last 3 results) No results for input(s): PROBNP in the last 8760 hours. HbA1C: No results for input(s): HGBA1C in the last 72 hours. CBG: No results for input(s): GLUCAP in the last 168 hours. Lipid Profile: No results for input(s): CHOL, HDL, LDLCALC, TRIG, CHOLHDL, LDLDIRECT in the last 72 hours. Thyroid Function Tests: No results for input(s): TSH, T4TOTAL, FREET4, T3FREE, THYROIDAB in the last 72 hours. Anemia Panel: No results for input(s): VITAMINB12, FOLATE, FERRITIN, TIBC, IRON, RETICCTPCT in the last 72 hours. Urine analysis:    Component Value Date/Time   BILIRUBINUR neg 11/06/2013 1858   PROTEINUR neg 11/06/2013 1858  UROBILINOGEN 1.0 11/06/2013 1858   NITRITE neg 11/06/2013 1858   LEUKOCYTESUR Negative 11/06/2013 1858    Radiological Exams on Admission personally reviewed: DG Chest 2 View  Result Date: 11/07/2019 CLINICAL DATA:  Shortness of breath. EXAM: CHEST - 2 VIEW COMPARISON:  None. FINDINGS: The heart size and mediastinal contours are within normal limits. No pneumothorax or pleural effusion is noted. Right lung is clear. Ill-defined left upper lobe density is noted which may represent focal inflammation, but neoplasm malignancy cannot be excluded. The visualized skeletal structures are unremarkable. IMPRESSION: Ill-defined left upper lobe density is noted which may represent focal inflammation, but neoplasm cannot be excluded. CT scan of the chest is recommended for further evaluation. Electronically Signed   By: Lupita Raider M.D.   On: 11/07/2019 14:45   CT Angio Chest PE W and/or Wo Contrast  Result Date: 11/07/2019 CLINICAL DATA:  Chest pain and dyspnea. Abnormal chest radiograph. EXAM: CT ANGIOGRAPHY CHEST WITH CONTRAST TECHNIQUE: Multidetector CT imaging of the chest was performed using the standard protocol during bolus administration of intravenous contrast. Multiplanar CT image reconstructions and MIPs were obtained  to evaluate the vascular anatomy. CONTRAST:  OMNIPAQUE IOHEXOL 350 MG/ML SOLN COMPARISON:  Chest radiograph from earlier today. FINDINGS: Cardiovascular: The study is moderate quality for the evaluation of pulmonary embolism, with mild motion degradation and suboptimal bolus opacification. There are no filling defects in the central, lobar, segmental or subsegmental pulmonary artery branches to suggest acute pulmonary embolism. Great vessels are normal in course and caliber. Normal heart size. No significant pericardial fluid/thickening. Mediastinum/Nodes: No discrete thyroid nodules. Unremarkable esophagus. No pathologically enlarged axillary, mediastinal or hilar lymph nodes. Lungs/Pleura: No pneumothorax. No pleural effusion. Widespread patchy poorly marginated consolidative and ground-glass nodules throughout both lungs, for example measuring 2.8 cm in the left lower lobe (series 7/image 58), 1.9 cm in the apical right upper lobe (series 7/image 20) and 3.8 cm in the left upper lobe (series 7/image 33). Upper abdomen: No acute abnormality. Musculoskeletal: No aggressive appearing focal osseous lesions. Moderate thoracic spondylosis. Review of the MIP images confirms the above findings. IMPRESSION: 1. No evidence of pulmonary embolism. 2. Widespread patchy poorly marginated consolidative and ground-glass nodules throughout both lungs. Atypical pneumonia suspected, with differential including COVID-19 pneumonia or cryptogenic organizing pneumonia. Recommend follow-up chest CT in 3 months to document resolution / exclude neoplasm. Electronically Signed   By: Delbert Phenix M.D.   On: 11/07/2019 18:45    EKG: Personally reviewed.  Rhythm is normal sinus rhythm with heart rate of 98 bpm.  No dynamic ST segment changes appreciated.  Incomplete right bundle branch block  Assessment/Plan Principal Problem:   Pneumonia due to COVID-19 virus   Patient presenting with several week history of [place] worsening  shortness of breath, weakness and chest pain with bilateral infiltrates on chest imaging and COVID-19 antigen testing positive all confirming COVID-19 infection with pneumonia  According to emergency department staff, patient's oxygen saturations were 90 to 91% on room air  Considering patient's substantial shortness of breath and oxygen saturations less than 94%, patient has been placed on supplemental oxygen  Additionally, due to low oxygen saturations intravenous remdesivir and intravenous dexamethasone have also been initiated for now  Providing patient with as needed albuterol via MDI for shortness of breath and wheezing.  Providing patient with intravenous volume resuscitation via isotonic fluids  Admitting patient to the COVID-19 unit  On ceftriaxone and azithromycin were initiated by the emergency department provider, I do not think  that this is necessary at this point.  Patient exhibits mild leukopenia, low-grade fever and only slightly elevated CRP.  Consider initiating antibacterials if patient worsens.  Active Problems:    Epilepsy ([place])   Continue home regimen of valproic acid     Code Status:  Full code Family Communication: Deferred  Status is: Inpatient  Remains inpatient appropriate because:IV treatments appropriate due to intensity of illness or inability to take PO and Inpatient level of care appropriate due to severity of illness   Dispo: The patient is from: Home              Anticipated d/c is to: Home              Anticipated d/c date is: 3 days              Patient currently is not medically stable to d/c.         Vernelle Emerald MD [facility] Pager [phone]  If 7PM-7AM, please contact night-coverage [email or web address] Use universal [place] password for that web site. If you do not have the password, please call the hospital operator.  11/08/2019, 12:37 AM

## 2019-11-08 NOTE — Progress Notes (Addendum)
PROGRESS NOTE                                                                                                                                                                                                             Patient Demographics:    Brett Rhodes, is a 59 y.o. male, DOB - 1960/11/23, BPZ:[id number]  Outpatient Primary MD for the patient is Tarri Fuller, MD   Admit date - 11/07/2019   LOS - 0  Chief Complaint  Patient presents with  . Chest Pain       Brief Narrative: Patient is a 59 y.o. male with PMHx of seizure disorder-who received a [employer] COVID-19 vaccination approximately on 4/8-following which he also started having URI/cough-like symptoms.  He started having shortness of breath-hence presented to [facility] ED-where he was found to have COVID-19 pneumonia.  He was subsequently admitted to the hospitalist service-see below for further details.  Significant Events: 4/17>> admit to [facility] for COVID-19 pneumonia  COVID-19 medications: Steroids: 4/17>> Remdesivir: 4/17>>  Antibiotics: None  Microbiology data: 4/17 >>blood culture: Pending  DVT prophylaxis: SQ Lovenox  Procedures: None  Consults: None    Subjective:    Marya Landry today feels much better than yesterday-he is not on oxygen.  Continues to have some cough.   Assessment  & Plan :    Covid 19 Viral pneumonia: Improved-not hypoxic-CRP only minimally elevated-plan is to continue steroids/remdesivir.  If clinical improvement continues-he would be a good candidate for outpatient remdesivir infusion-we will reassess tomorrow.  Fever: afebrile  O2 requirements:  SpO2: 93 % O2 Flow Rate (L/min): 2 L/min   COVID-19 Labs: Recent Labs    11/07/19 2228 11/08/19 0400  DDIMER 0.65*  --   FERRITIN  --  508*  LDH 230*  --   CRP 1.7* 2.2*       Component Value Date/Time   BNP 18.6 11/07/2019 1805    No results for input(s):  PROCALCITON in the last 168 hours.  Lab Results  Component Value Date   SARSCOV2NAA POSITIVE (A) 11/07/2019     Prone/Incentive Spirometry: encouraged  incentive spirometry use 3-4/hour.  Leukopenia/thrombocytopenia: Likely secondary to COVID-19 infection-follow/monitor for now.  Seizure disorder: Continue Depakote.  Hypomagnesemia: Replete and recheck  ABG:    Component Value Date/Time   PHART 7.424 11/07/2019 2245   PCO2ART 37.1 11/07/2019  2245   PO2ART 69.0 (L) 11/07/2019 2245   HCO3 24.1 11/07/2019 2245   TCO2 25 11/07/2019 2245   O2SAT 94.0 11/07/2019 2245    Vent Settings: N/A   Condition - Stabl  Family Communication  : Patient will update family himself-I have asked him to let me know if his family has questions.  Code Status :  Full Code  Diet :  Diet Order            Diet regular Room service appropriate? Yes; Fluid consistency: Thin  Diet effective now               Disposition Plan  :  Remain hospitalized-if improvement continues-likely home on 4/19  Barriers to discharge: Complete 5 days of IV Remdesivir  Antimicorbials  :    Anti-infectives (From admission, onward)   Start     Dose/Rate Route Frequency Ordered Stop   11/09/19 1000  remdesivir 100 mg in sodium chloride 0.9 % 100 mL IVPB     100 mg 200 mL/hr over 30 Minutes Intravenous Daily 11/08/19 0047 11/13/19 0959   11/08/19 0100  remdesivir 200 mg in sodium chloride 0.9% 250 mL IVPB     200 mg 580 mL/hr over 30 Minutes Intravenous Once 11/08/19 0047 11/08/19 0214   11/07/19 2215  cefTRIAXone (ROCEPHIN) 1 g in sodium chloride 0.9 % 100 mL IVPB  Status:  Discontinued     1 g 200 mL/hr over 30 Minutes Intravenous  Once 11/07/19 2144 11/08/19 0030   11/07/19 2215  azithromycin (ZITHROMAX) 500 mg in sodium chloride 0.9 % 250 mL IVPB  Status:  Discontinued     500 mg 250 mL/hr over 60 Minutes Intravenous  Once 11/07/19 2144 11/08/19 0030      Inpatient Medications  Scheduled Meds: .  benzonatate  200 mg Oral TID  . dexamethasone (DECADRON) injection  6 mg Intravenous Q0600  . divalproex  1,000 mg Oral QHS  . enoxaparin (LOVENOX) injection  40 mg Subcutaneous Q0600   Continuous Infusions: . lactated ringers 10 mL/hr at 11/08/19 0745  . [START ON 11/09/2019] remdesivir 100 mg in NS 100 mL     PRN Meds:.acetaminophen, albuterol, chlorpheniramine-HYDROcodone, guaiFENesin-dextromethorphan, ondansetron **OR** ondansetron (ZOFRAN) IV, polyethylene glycol   Time Spent in minutes  25  See all Orders from today for further details   Jeoffrey Massed M.D on 11/08/2019 at 10:54 AM  To page go to [email or web address] - use universal password  [facility] -  Office  [phone]    Objective:   Vitals:   11/08/19 0130 11/08/19 0200 11/08/19 0235 11/08/19 0400  BP: 135/82 119/77 131/79 119/81  Pulse: 77 78 77 68  Resp: (!) 23 (!) 21 19 17   Temp:  100.2 F (37.9 C) 99.5 F (37.5 C) 99 F (37.2 C)  TempSrc:  Oral Oral Oral  SpO2: 96% 97% 95% 93%  Weight:   113.5 kg   Height:   6\' 1"  (1.854 m)     Wt Readings from Last 3 Encounters:  11/08/19 113.5 kg  11/06/13 109.3 kg     Intake/Output Summary (Last 24 hours) at 11/08/2019 1054 Last data filed at 11/08/2019 0526 Gross per 24 hour  Intake 1733.1 ml  Output --  Net 1733.1 ml     Physical Exam Gen Exam:Alert awake-not in any distress HEENT:atraumatic, normocephalic Chest: B/L clear to auscultation anteriorly CVS:S1S2 regular Abdomen:soft non tender, non distended Extremities:no edema Neurology: Non focal Skin: no rash   Data Review:  CBC Recent Labs  Lab 11/07/19 1418 11/07/19 2245 11/08/19 0400  WBC 3.3*  --  3.4*  HGB 15.1 12.9* 13.6  HCT 47.0 38.0* 40.9  PLT 94*  --  83*  MCV 98.7  --  96.2  MCH 31.7  --  32.0  MCHC 32.1  --  33.3  RDW 12.5  --  12.6  LYMPHSABS  --   --  1.0  MONOABS  --   --  0.1  EOSABS  --   --  0.0  BASOSABS  --   --  0.0    Chemistries  Recent Labs  Lab  11/07/19 1418 11/07/19 1805 11/07/19 2245 11/08/19 0400  NA 140  --  139 138  K 4.3  --  3.8 4.0  CL 105  --   --  104  CO2 24  --   --  23  GLUCOSE 96  --   --  130*  BUN 14  --   --  13  CREATININE 1.05  --   --  1.18  CALCIUM 9.0  --   --  8.3*  MG  --  1.7  --  1.6*  AST  --  33  --  34  ALT  --  34  --  31  ALKPHOS  --  60  --  52  BILITOT  --  0.4  --  0.4   ------------------------------------------------------------------------------------------------------------------ No results for input(s): CHOL, HDL, LDLCALC, TRIG, CHOLHDL, LDLDIRECT in the last 72 hours.  No results found for: HGBA1C ------------------------------------------------------------------------------------------------------------------ No results for input(s): TSH, T4TOTAL, T3FREE, THYROIDAB in the last 72 hours.  Invalid input(s): FREET3 ------------------------------------------------------------------------------------------------------------------ Recent Labs    11/08/19 0400  FERRITIN 508*    Coagulation profile No results for input(s): INR, PROTIME in the last 168 hours.  Recent Labs    11/07/19 2228  DDIMER 0.65*    Cardiac Enzymes No results for input(s): CKMB, TROPONINI, MYOGLOBIN in the last 168 hours.  Invalid input(s): CK ------------------------------------------------------------------------------------------------------------------    Component Value Date/Time   BNP 18.6 11/07/2019 1805    Micro Results Recent Results (from the past 240 hour(s))  SARS CORONAVIRUS 2 (TAT 6-24 HRS) Nasopharyngeal Nasopharyngeal Swab     Status: Abnormal   Collection Time: 11/07/19  6:56 PM   Specimen: Nasopharyngeal Swab  Result Value Ref Range Status   SARS Coronavirus 2 POSITIVE (A) NEGATIVE Final    Comment: RESULT CALLED TO, READ BACK BY AND VERIFIED WITH: RN A MAHAT @0507  11/08/19 BY S GEZAHEGN  (NOTE) SARS-CoV-2 target nucleic acids are DETECTED. The SARS-CoV-2 RNA is generally  detectable in upper and lower respiratory specimens during the acute phase of infection. Positive results are indicative of the presence of SARS-CoV-2 RNA. Clinical correlation with patient history and other diagnostic information is  necessary to determine patient infection status. Positive results do not rule out bacterial infection or co-infection with other viruses.  The expected result is Negative. Fact Sheet for Patients: SugarRoll.be Fact Sheet for Healthcare Providers: https://www.woods-mathews.com/ This test is not yet approved or cleared by the [place] FDA and  has been authorized for detection and/or diagnosis of SARS-CoV-2 by FDA under an Emergency Use Authorization (EUA). This EUA will remain  in effect (meaning this test can be used) for  the duration of the COVID-19 declaration under Section 564(b)(1) of the Act, 21 U.S.C. section 360bbb-3(b)(1), unless the authorization is terminated or revoked sooner. Performed at [facility], [place],  [place] [address]   Blood culture (routine x 2)     Status: None (Preliminary result)   Collection Time: 11/07/19 10:28 PM   Specimen: BLOOD LEFT FOREARM  Result Value Ref Range Status   Specimen Description BLOOD LEFT FOREARM  Final   Special Requests   Final    BOTTLES DRAWN AEROBIC AND ANAEROBIC Blood Culture results may not be optimal due to an excessive volume of blood received in culture bottles   Culture   Final    NO GROWTH < 12 HOURS Performed at [facility], [address], [place], [place] [address]    Report Status PENDING  Incomplete    Radiology Reports DG Chest 2 View  Result Date: 11/07/2019 CLINICAL DATA:  Shortness of breath. EXAM: CHEST - 2 VIEW COMPARISON:  None. FINDINGS: The heart size and mediastinal contours are within normal limits. No pneumothorax or pleural effusion is noted. Right lung is clear. Ill-defined left upper lobe  density is noted which may represent focal inflammation, but neoplasm malignancy cannot be excluded. The visualized skeletal structures are unremarkable. IMPRESSION: Ill-defined left upper lobe density is noted which may represent focal inflammation, but neoplasm cannot be excluded. CT scan of the chest is recommended for further evaluation. Electronically Signed   By: Lupita Raider M.D.   On: 11/07/2019 14:45   CT Angio Chest PE W and/or Wo Contrast  Result Date: 11/07/2019 CLINICAL DATA:  Chest pain and dyspnea. Abnormal chest radiograph. EXAM: CT ANGIOGRAPHY CHEST WITH CONTRAST TECHNIQUE: Multidetector CT imaging of the chest was performed using the standard protocol during bolus administration of intravenous contrast. Multiplanar CT image reconstructions and MIPs were obtained to evaluate the vascular anatomy. CONTRAST:  OMNIPAQUE IOHEXOL 350 MG/ML SOLN COMPARISON:  Chest radiograph from earlier today. FINDINGS: Cardiovascular: The study is moderate quality for the evaluation of pulmonary embolism, with mild motion degradation and suboptimal bolus opacification. There are no filling defects in the central, lobar, segmental or subsegmental pulmonary artery branches to suggest acute pulmonary embolism. Great vessels are normal in course and caliber. Normal heart size. No significant pericardial fluid/thickening. Mediastinum/Nodes: No discrete thyroid nodules. Unremarkable esophagus. No pathologically enlarged axillary, mediastinal or hilar lymph nodes. Lungs/Pleura: No pneumothorax. No pleural effusion. Widespread patchy poorly marginated consolidative and ground-glass nodules throughout both lungs, for example measuring 2.8 cm in the left lower lobe (series 7/image 58), 1.9 cm in the apical right upper lobe (series 7/image 20) and 3.8 cm in the left upper lobe (series 7/image 33). Upper abdomen: No acute abnormality. Musculoskeletal: No aggressive appearing focal osseous lesions. Moderate thoracic  spondylosis. Review of the MIP images confirms the above findings. IMPRESSION: 1. No evidence of pulmonary embolism. 2. Widespread patchy poorly marginated consolidative and ground-glass nodules throughout both lungs. Atypical pneumonia suspected, with differential including COVID-19 pneumonia or cryptogenic organizing pneumonia. Recommend follow-up chest CT in 3 months to document resolution / exclude neoplasm. Electronically Signed   By: Delbert Phenix M.D.   On: 11/07/2019 18:45

## 2019-11-09 LAB — CBC
HCT: 40 % (ref 39.0–52.0)
Hemoglobin: 13.6 g/dL (ref 13.0–17.0)
MCH: 32.3 pg (ref 26.0–34.0)
MCHC: 34 g/dL (ref 30.0–36.0)
MCV: 95 fL (ref 80.0–100.0)
Platelets: 92 10*3/uL — ABNORMAL LOW (ref 150–400)
RBC: 4.21 MIL/uL — ABNORMAL LOW (ref 4.22–5.81)
RDW: 12.1 % (ref 11.5–15.5)
WBC: 3.5 10*3/uL — ABNORMAL LOW (ref 4.0–10.5)
nRBC: 0 % (ref 0.0–0.2)

## 2019-11-09 LAB — COMPREHENSIVE METABOLIC PANEL
ALT: 30 U/L (ref 0–44)
AST: 31 U/L (ref 15–41)
Albumin: 2.8 g/dL — ABNORMAL LOW (ref 3.5–5.0)
Alkaline Phosphatase: 51 U/L (ref 38–126)
Anion gap: 8 (ref 5–15)
BUN: 16 mg/dL (ref 6–20)
CO2: 26 mmol/L (ref 22–32)
Calcium: 8.2 mg/dL — ABNORMAL LOW (ref 8.9–10.3)
Chloride: 106 mmol/L (ref 98–111)
Creatinine, Ser: 1.11 mg/dL (ref 0.61–1.24)
GFR calc Af Amer: >60 mL/min (ref >60)
GFR calc non Af Amer: >60 mL/min (ref >60)
Glucose, Bld: 119 mg/dL — ABNORMAL HIGH (ref 70–99)
Potassium: 4.3 mmol/L (ref 3.5–5.1)
Sodium: 140 mmol/L (ref 135–145)
Total Bilirubin: 0.6 mg/dL (ref 0.3–1.2)
Total Protein: 5.8 g/dL — ABNORMAL LOW (ref 6.5–8.1)

## 2019-11-09 LAB — D-DIMER, QUANTITATIVE: D-Dimer, Quant: 0.44 ug/mL-FEU (ref 0.00–0.50)

## 2019-11-09 LAB — C-REACTIVE PROTEIN: CRP: 0.8 mg/dL (ref <1.0)

## 2019-11-09 LAB — FERRITIN: Ferritin: 535 ng/mL — ABNORMAL HIGH (ref 24–336)

## 2019-11-09 LAB — MAGNESIUM: Magnesium: 2.2 mg/dL (ref 1.7–2.4)

## 2019-11-09 MED ORDER — ALBUTEROL SULFATE HFA 108 (90 BASE) MCG/ACT IN AERS: 2.0000 | INHALATION_SPRAY | RESPIRATORY_TRACT | 0 refills | Status: AC | PRN

## 2019-11-09 MED ORDER — BENZONATATE 200 MG PO CAPS: 200.0000 mg | ORAL_CAPSULE | Freq: Three times a day (TID) | ORAL | 0 refills | Status: AC | PRN

## 2019-11-09 NOTE — Discharge Summary (Signed)
PATIENT DETAILS Name: Brett Rhodes Age: 59 y.o. Sex: male Date of Birth: 1961/01/16 MRN: [id number]. Admitting Physician: Marinda Elk, MD WUJ:[id number], Gerlene Burdock, MD  Admit Date: 11/07/2019 Discharge date: 11/09/2019  Recommendations for Outpatient Follow-up:  1. Follow up with PCP in 1-2 weeks 2. Please obtain CMP/CBC in one week 3. Repeat CT chest in 3 months-as recommended by radiology 4. Follow blood cultures until final   Admitted From:  Home  Disposition: Home   Home Health: No  Equipment/Devices: None  Discharge Condition: Stable  CODE STATUS: FULL CODE  Diet recommendation:  Diet Order            Diet general        Diet regular Room service appropriate? Yes; Fluid consistency: Thin  Diet effective now               Brief Narrative: Patient is a 59 y.o. male with PMHx of seizure disorder-who received a J&J COVID-19 vaccination approximately on 4/8-following which he also started having URI/cough-like symptoms.  He started having shortness of breath-hence presented to [facility] ED-where he was found to have COVID-19 pneumonia.  He was subsequently admitted to the hospitalist service-see below for further details.  Significant Events: 4/17>> admit to [facility] for COVID-19 pneumonia  COVID-19 medications: Steroids: 4/17>> 4/19 Remdesivir: 4/17>>  Antibiotics: None  Microbiology data: 4/17 >>blood culture: No growth  Procedures: None  Consults: None  Brief Hospital Course: Covid 19 Viral pneumonia: Feels much better-ambulated in the hallway couple of times yesterday without any shortness of breath-CRP only minimally elevated.  Placed on both steroids and remdesivir on admission-since he is not hypoxic and CRP is normalized-does not require steroids, he will continue remdesivir infusion in the infusion center.  Patient is agreeable with the plan.  Patient asked to follow-up with PCP, he will need a repeat CT chest as recommended by  radiology in 3 months.   COVID-19 Labs:  Recent Labs    11/07/19 2228 11/08/19 0400 11/09/19 0302  DDIMER 0.65*  --  0.44  FERRITIN  --  508* 535*  LDH 230*  --   --   CRP 1.7* 2.2* 0.8    Lab Results  Component Value Date   SARSCOV2NAA POSITIVE (A) 11/07/2019    Leukopenia/thrombocytopenia: Likely secondary to COVID-19 infection-follow/monitor for now.  Seizure disorder: Continue Depakote.  Hypomagnesemia: Repleted  Obesity: Estimated body mass index is 33.01 kg/m as calculated from the following:   Height as of this encounter:  (1.854 m).   Weight as of this encounter: 113.5 kg.    Discharge Diagnoses:  Principal Problem:   Pneumonia due to COVID-19 virus Active Problems:   Epilepsy [facility])   Discharge Instructions:    Person Under Monitoring Name: Brett Rhodes  Location: [address] [place] [address]   Infection Prevention Recommendations for Individuals Confirmed to have, or Being Evaluated for, 2019 Novel Coronavirus (COVID-19) Infection Who Receive Care at Home  Individuals who are confirmed to have, or are being evaluated for, COVID-19 should follow the prevention steps below until a healthcare provider or local or state health department says they can return to normal activities.  Stay home except to get medical care You should restrict activities outside your home, except for getting medical care. Do not go to work, school, or public areas, and do not use public transportation or taxis.  Call ahead before visiting your doctor Before your medical appointment, call the healthcare provider and tell them  that you have, or are being evaluated for, COVID-19 infection. This will help the healthcare providers office take steps to keep other people from getting infected. Ask your healthcare provider to call the local or state health department.  Monitor your symptoms Seek prompt medical attention if your illness is  worsening (e.g., difficulty breathing). Before going to your medical appointment, call the healthcare provider and tell them that you have, or are being evaluated for, COVID-19 infection. Ask your healthcare provider to call the local or state health department.  Wear a facemask You should wear a facemask that covers your nose and mouth when you are in the same room with other people and when you visit a healthcare provider. People who live with or visit you should also wear a facemask while they are in the same room with you.  Separate yourself from other people in your home As much as possible, you should stay in a different room from other people in your home. Also, you should use a separate bathroom, if available.  Avoid sharing household items You should not share dishes, drinking glasses, cups, eating utensils, towels, bedding, or other items with other people in your home. After using these items, you should wash them thoroughly with soap and water.  Cover your coughs and sneezes Cover your mouth and nose with a tissue when you cough or sneeze, or you can cough or sneeze into your sleeve. Throw used tissues in a lined trash can, and immediately wash your hands with soap and water for at least 20 seconds or use an alcohol-based hand rub.  Wash your [employer] your hands often and thoroughly with soap and water for at least 20 seconds. You can use an alcohol-based hand sanitizer if soap and water are not available and if your hands are not visibly dirty. Avoid touching your eyes, nose, and mouth with unwashed hands.   Prevention Steps for Caregivers and Household Members of Individuals Confirmed to have, or Being Evaluated for, COVID-19 Infection Being Cared for in the Home  If you live with, or provide care at home for, a person confirmed to have, or being evaluated for, COVID-19 infection please follow these guidelines to prevent infection:  Follow healthcare providers  instructions Make sure that you understand and can help the patient follow any healthcare provider instructions for all care.  Provide for the patients basic needs You should help the patient with basic needs in the home and provide support for getting groceries, prescriptions, and other personal needs.  Monitor the patients symptoms If they are getting sicker, call his or her medical provider and tell them that the patient has, or is being evaluated for, COVID-19 infection. This will help the healthcare providers office take steps to keep other people from getting infected. Ask the healthcare provider to call the local or state health department.  Limit the number of people who have contact with the patient  If possible, have only one caregiver for the patient.  Other household members should stay in another home or place of residence. If this is not possible, they should stay  in another room, or be separated from the patient as much as possible. Use a separate bathroom, if available.  Restrict visitors who do not have an essential need to be in the home.  Keep older adults, very young children, and other sick people away from the patient Keep older adults, very young children, and those who have compromised immune systems or chronic health conditions  away from the patient. This includes people with chronic heart, lung, or kidney conditions, diabetes, and cancer.  Ensure good ventilation Make sure that shared spaces in the home have good air flow, such as from an air conditioner or an opened window, weather permitting.  Wash your hands often  Wash your hands often and thoroughly with soap and water for at least 20 seconds. You can use an alcohol based hand sanitizer if soap and water are not available and if your hands are not visibly dirty.  Avoid touching your eyes, nose, and mouth with unwashed hands.  Use disposable paper towels to dry your hands. If not available, use  dedicated cloth towels and replace them when they become wet.  Wear a facemask and gloves  Wear a disposable facemask at all times in the room and gloves when you touch or have contact with the patients blood, body fluids, and/or secretions or excretions, such as sweat, saliva, sputum, nasal mucus, vomit, urine, or feces.  Ensure the mask fits over your nose and mouth tightly, and do not touch it during use.  Throw out disposable facemasks and gloves after using them. Do not reuse.  Wash your hands immediately after removing your facemask and gloves.  If your personal clothing becomes contaminated, carefully remove clothing and launder. Wash your hands after handling contaminated clothing.  Place all used disposable facemasks, gloves, and other waste in a lined container before disposing them with other household waste.  Remove gloves and wash your hands immediately after handling these items.  Do not share dishes, glasses, or other household items with the patient  Avoid sharing household items. You should not share dishes, drinking glasses, cups, eating utensils, towels, bedding, or other items with a patient who is confirmed to have, or being evaluated for, COVID-19 infection.  After the person uses these items, you should wash them thoroughly with soap and water.  Wash laundry thoroughly  Immediately remove and wash clothes or bedding that have blood, body fluids, and/or secretions or excretions, such as sweat, saliva, sputum, nasal mucus, vomit, urine, or feces, on them.  Wear gloves when handling laundry from the patient.  Read and follow directions on labels of laundry or clothing items and detergent. In general, wash and dry with the warmest temperatures recommended on the label.  Clean all areas the individual has used often  Clean all touchable surfaces, such as counters, tabletops, doorknobs, bathroom fixtures, toilets, phones, keyboards, tablets, and bedside tables, every  day. Also, clean any surfaces that may have blood, body fluids, and/or secretions or excretions on them.  Wear gloves when cleaning surfaces the patient has come in contact with.  Use a diluted bleach solution (e.g., dilute bleach with 1 part bleach and 10 parts water) or a household disinfectant with a label that says EPA-registered for coronaviruses. To make a bleach solution at home, add 1 tablespoon of bleach to 1 quart (4 cups) of water. For a larger supply, add  cup of bleach to 1 gallon (16 cups) of water.  Read labels of cleaning products and follow recommendations provided on product labels. Labels contain instructions for safe and effective use of the cleaning product including precautions you should take when applying the product, such as wearing gloves or eye protection and making sure you have good ventilation during use of the product.  Remove gloves and wash hands immediately after cleaning.  Monitor yourself for signs and symptoms of illness Caregivers and household members are considered close contacts,  should monitor their health, and will be asked to limit movement outside of the home to the extent possible. Follow the monitoring steps for close contacts listed on the symptom monitoring form.   ? If you have additional questions, contact your local health department or call the epidemiologist on call at [phone] (available 24/7). ? This guidance is subject to change. For the most up-to-date guidance from [facility], please refer to their website: YouBlogs.pl    Activity:  As tolerated   Discharge Instructions    Call MD for:  difficulty breathing, headache or visual disturbances   Complete by: As directed    Call MD for:  extreme fatigue   Complete by: As directed    Call MD for:  persistant dizziness or light-headedness   Complete by: As directed    Diet general   Complete by: As directed    Discharge  instructions   Complete by: As directed    1.)  You have been scheduled for outpatient Remdesivir infusion at 830 AM on Tuesday 4/20, Wednesday 4/21 and Thursday 4/22.    Please advise them to report to [facility] at [address]  Drive to the security guard and tell them you are here for an infusion. They will direct you to the front entrance where we will come and get you.  For questions call [phone].  Thanks   2.)  3 weeks of isolation from 11/07/2019   Follow with Primary MD  Rubie Maid, MD in 1-2 weeks  Please ask your primary care practitioner to repeat CT chest in 3 months to ensure resolution of the pneumonia seen on CT  Please get a complete blood count and chemistry panel checked by your Primary MD at your next visit, and again as instructed by your Primary MD.  Get Medicines reviewed and adjusted: Please take all your medications with you for your next visit with your Primary MD  Laboratory/radiological data: Please request your Primary MD to go over all hospital tests and procedure/radiological results at the follow up, please ask your Primary MD to get all Hospital records sent to his/her office.  In some cases, they will be blood work, cultures and biopsy results pending at the time of your discharge. Please request that your primary care M.D. follows up on these results.  Also Note the following: If you experience worsening of your admission symptoms, develop shortness of breath, life threatening emergency, suicidal or homicidal thoughts you must seek medical attention immediately by calling 911 or calling your MD immediately  if symptoms less severe.  You must read complete instructions/literature along with all the possible adverse reactions/side effects for all the Medicines you take and that have been prescribed to you. Take any new Medicines after you have completely understood and accpet all the possible adverse reactions/side effects.   Do not  drive when taking Pain medications or sleeping medications (Benzodaizepines)  Do not take more than prescribed Pain, Sleep and Anxiety Medications. It is not advisable to combine anxiety,sleep and pain medications without talking with your primary care practitioner  Special Instructions: If you have smoked or chewed Tobacco  in the last 2 yrs please stop smoking, stop any regular Alcohol  and or any Recreational drug use.  Wear Seat belts while driving.  Please note: You were cared for by a hospitalist during your hospital stay. Once you are discharged, your primary care physician will handle any further medical issues. Please note that NO REFILLS for any  discharge medications will be authorized once you are discharged, as it is imperative that you return to your primary care physician (or establish a relationship with a primary care physician if you do not have one) for your post hospital discharge needs so that they can reassess your need for medications and monitor your lab values.   Increase activity slowly   Complete by: As directed      Allergies as of 11/09/2019      Reactions   Prednisone Other (See Comments)   Unable to take prednisone pills,gives really bad stomach pain      Medication List    STOP taking these medications   cyclobenzaprine 10 MG tablet Commonly known as: FLEXERIL   diclofenac 75 MG EC tablet Commonly known as: VOLTAREN     TAKE these medications   albuterol 108 (90 Base) MCG/ACT inhaler Commonly known as: VENTOLIN HFA Inhale 2 puffs into the lungs every 4 (four) hours as needed for wheezing or shortness of breath.   benzonatate 200 MG capsule Commonly known as: TESSALON Take 1 capsule (200 mg total) by mouth 3 (three) times daily as needed for cough.   divalproex 500 MG 24 hr tablet Commonly known as: DEPAKOTE ER Take 1,000 mg by mouth at bedtime.   ibuprofen 200 MG tablet Commonly known as: ADVIL Take 400 mg by mouth every 6 (six) hours as  needed for mild pain.   multivitamin with minerals Tabs tablet Take 1 tablet by mouth daily.      Follow-up Information    Tarri Fuller, MD. Schedule an appointment as soon as possible for a visit in 1 week(s).   Specialty: Family Medicine Contact information: [address] [place] [address] [phone]          Allergies  Allergen Reactions   Prednisone Other (See Comments)    Unable to take prednisone pills,gives really bad stomach pain      Other Procedures/Studies: DG Chest 2 View  Result Date: 11/07/2019 CLINICAL DATA:  Shortness of breath. EXAM: CHEST - 2 VIEW COMPARISON:  None. FINDINGS: The heart size and mediastinal contours are within normal limits. No pneumothorax or pleural effusion is noted. Right lung is clear. Ill-defined left upper lobe density is noted which may represent focal inflammation, but neoplasm malignancy cannot be excluded. The visualized skeletal structures are unremarkable. IMPRESSION: Ill-defined left upper lobe density is noted which may represent focal inflammation, but neoplasm cannot be excluded. CT scan of the chest is recommended for further evaluation. Electronically Signed   By: Lupita Raider M.D.   On: 11/07/2019 14:45   CT Angio Chest PE W and/or Wo Contrast  Result Date: 11/07/2019 CLINICAL DATA:  Chest pain and dyspnea. Abnormal chest radiograph. EXAM: CT ANGIOGRAPHY CHEST WITH CONTRAST TECHNIQUE: Multidetector CT imaging of the chest was performed using the standard protocol during bolus administration of intravenous contrast. Multiplanar CT image reconstructions and MIPs were obtained to evaluate the vascular anatomy. CONTRAST:  OMNIPAQUE IOHEXOL 350 MG/ML SOLN COMPARISON:  Chest radiograph from earlier today. FINDINGS: Cardiovascular: The study is moderate quality for the evaluation of pulmonary embolism, with mild motion degradation and suboptimal bolus opacification. There are no filling defects in the  central, lobar, segmental or subsegmental pulmonary artery branches to suggest acute pulmonary embolism. Great vessels are normal in course and caliber. Normal heart size. No significant pericardial fluid/thickening. Mediastinum/Nodes: No discrete thyroid nodules. Unremarkable esophagus. No pathologically enlarged axillary, mediastinal or hilar lymph nodes. Lungs/Pleura: No pneumothorax. No  pleural effusion. Widespread patchy poorly marginated consolidative and ground-glass nodules throughout both lungs, for example measuring 2.8 cm in the left lower lobe (series 7/image 58), 1.9 cm in the apical right upper lobe (series 7/image 20) and 3.8 cm in the left upper lobe (series 7/image 33). Upper abdomen: No acute abnormality. Musculoskeletal: No aggressive appearing focal osseous lesions. Moderate thoracic spondylosis. Review of the MIP images confirms the above findings. IMPRESSION: 1. No evidence of pulmonary embolism. 2. Widespread patchy poorly marginated consolidative and ground-glass nodules throughout both lungs. Atypical pneumonia suspected, with differential including COVID-19 pneumonia or cryptogenic organizing pneumonia. Recommend follow-up chest CT in 3 months to document resolution / exclude neoplasm. Electronically Signed   By: Delbert Phenix M.D.   On: 11/07/2019 18:45    TODAY-DAY OF DISCHARGE:  Subjective:   Brett Rhodes today has no headache,no chest abdominal pain,no new weakness tingling or numbness, feels much better wants to go home today.   Objective:   Blood pressure 123/72, pulse 61, temperature 98.2 F (36.8 C), temperature source Oral, resp. rate 17, height  (1.854 m), weight 113.5 kg, SpO2 93 %.  Intake/Output Summary (Last 24 hours) at 11/09/2019 1003 Last data filed at 11/09/2019 0554 Gross per 24 hour  Intake 537.47 ml  Output --  Net 537.47 ml   Filed Weights   11/07/19 1408 11/08/19 0235  Weight: 111.1 kg 113.5 kg    Exam: Awake Alert, Oriented *3, No new  F.N deficits, Normal affect [place].AT,PERRAL Supple Neck,No JVD, No cervical lymphadenopathy appriciated.  Symmetrical Chest wall movement, Good air movement bilaterally, CTAB RRR,No Gallops,Rubs or new Murmurs, No Parasternal Heave +ve B.Sounds, Abd Soft, Non tender, No organomegaly appriciated, No rebound -guarding or rigidity. No Cyanosis, Clubbing or edema, No new Rash or bruise   PERTINENT RADIOLOGIC STUDIES: DG Chest 2 View  Result Date: 11/07/2019 CLINICAL DATA:  Shortness of breath. EXAM: CHEST - 2 VIEW COMPARISON:  None. FINDINGS: The heart size and mediastinal contours are within normal limits. No pneumothorax or pleural effusion is noted. Right lung is clear. Ill-defined left upper lobe density is noted which may represent focal inflammation, but neoplasm malignancy cannot be excluded. The visualized skeletal structures are unremarkable. IMPRESSION: Ill-defined left upper lobe density is noted which may represent focal inflammation, but neoplasm cannot be excluded. CT scan of the chest is recommended for further evaluation. Electronically Signed   By: Lupita Raider M.D.   On: 11/07/2019 14:45   CT Angio Chest PE W and/or Wo Contrast  Result Date: 11/07/2019 CLINICAL DATA:  Chest pain and dyspnea. Abnormal chest radiograph. EXAM: CT ANGIOGRAPHY CHEST WITH CONTRAST TECHNIQUE: Multidetector CT imaging of the chest was performed using the standard protocol during bolus administration of intravenous contrast. Multiplanar CT image reconstructions and MIPs were obtained to evaluate the vascular anatomy. CONTRAST:  OMNIPAQUE IOHEXOL 350 MG/ML SOLN COMPARISON:  Chest radiograph from earlier today. FINDINGS: Cardiovascular: The study is moderate quality for the evaluation of pulmonary embolism, with mild motion degradation and suboptimal bolus opacification. There are no filling defects in the central, lobar, segmental or subsegmental pulmonary artery branches to suggest acute pulmonary  embolism. Great vessels are normal in course and caliber. Normal heart size. No significant pericardial fluid/thickening. Mediastinum/Nodes: No discrete thyroid nodules. Unremarkable esophagus. No pathologically enlarged axillary, mediastinal or hilar lymph nodes. Lungs/Pleura: No pneumothorax. No pleural effusion. Widespread patchy poorly marginated consolidative and ground-glass nodules throughout both lungs, for example measuring 2.8 cm in the left lower lobe (series 7/image  58), 1.9 cm in the apical right upper lobe (series 7/image 20) and 3.8 cm in the left upper lobe (series 7/image 33). Upper abdomen: No acute abnormality. Musculoskeletal: No aggressive appearing focal osseous lesions. Moderate thoracic spondylosis. Review of the MIP images confirms the above findings. IMPRESSION: 1. No evidence of pulmonary embolism. 2. Widespread patchy poorly marginated consolidative and ground-glass nodules throughout both lungs. Atypical pneumonia suspected, with differential including COVID-19 pneumonia or cryptogenic organizing pneumonia. Recommend follow-up chest CT in 3 months to document resolution / exclude neoplasm. Electronically Signed   By: Delbert Phenix M.D.   On: 11/07/2019 18:45     PERTINENT LAB RESULTS: CBC: Recent Labs    11/08/19 0400 11/09/19 0302  WBC 3.4* 3.5*  HGB 13.6 13.6  HCT 40.9 40.0  PLT 83* 92*   CMET CMP     Component Value Date/Time   NA 140 11/09/2019 0302   K 4.3 11/09/2019 0302   CL 106 11/09/2019 0302   CO2 26 11/09/2019 0302   GLUCOSE 119 (H) 11/09/2019 0302   BUN 16 11/09/2019 0302   CREATININE 1.11 11/09/2019 0302   CREATININE 1.35 11/06/2013 1857   CALCIUM 8.2 (L) 11/09/2019 0302   PROT 5.8 (L) 11/09/2019 0302   ALBUMIN 2.8 (L) 11/09/2019 0302   AST 31 11/09/2019 0302   ALT 30 11/09/2019 0302   ALKPHOS 51 11/09/2019 0302   BILITOT 0.6 11/09/2019 0302   GFRNONAA >60 11/09/2019 0302   GFRAA >60 11/09/2019 0302    GFR Estimated Creatinine Clearance:  98.1 mL/min (by C-G formula based on SCr of 1.11 mg/dL). Recent Labs    11/07/19 1805  LIPASE 41   No results for input(s): CKTOTAL, CKMB, CKMBINDEX, TROPONINI in the last 72 hours. Invalid input(s): POCBNP Recent Labs    11/07/19 2228 11/09/19 0302  DDIMER 0.65* 0.44   No results for input(s): HGBA1C in the last 72 hours. No results for input(s): CHOL, HDL, LDLCALC, TRIG, CHOLHDL, LDLDIRECT in the last 72 hours. No results for input(s): TSH, T4TOTAL, T3FREE, THYROIDAB in the last 72 hours.  Invalid input(s): FREET3 Recent Labs    11/08/19 0400 11/09/19 0302  FERRITIN 508* 535*   Coags: No results for input(s): INR in the last 72 hours.  Invalid input(s): PT Microbiology: Recent Results (from the past 240 hour(s))  SARS CORONAVIRUS 2 (TAT 6-24 HRS) Nasopharyngeal Nasopharyngeal Swab     Status: Abnormal   Collection Time: 11/07/19  6:56 PM   Specimen: Nasopharyngeal Swab  Result Value Ref Range Status   SARS Coronavirus 2 POSITIVE (A) NEGATIVE Final    Comment: RESULT CALLED TO, READ BACK BY AND VERIFIED WITH: RN A MAHAT @[id number]  11/08/19 BY S GEZAHEGN  (NOTE) SARS-CoV-2 target nucleic acids are DETECTED. The SARS-CoV-2 RNA is generally detectable in upper and lower respiratory specimens during the acute phase of infection. Positive results are indicative of the presence of SARS-CoV-2 RNA. Clinical correlation with patient history and other diagnostic information is  necessary to determine patient infection status. Positive results do not rule out bacterial infection or co-infection with other viruses.  The expected result is Negative. Fact Sheet for Patients: 11/10/19 Fact Sheet for Healthcare Providers: HairSlick.no This test is not yet approved or cleared by the quierodirigir.com FDA and  has been authorized for detection and/or diagnosis of SARS-CoV-2 by FDA under an Emergency Use Authorization (EUA). This  EUA will remain  in effect (meaning this test can be used) for  the duration of the COVID-19 declaration under  Section 564(b)(1) of the Act, 21 U.S.C. section 360bbb-3(b)(1), unless the authorization is terminated or revoked sooner. Performed at [facility], [address] [place], [place] [address]   Blood culture (routine x 2)     Status: None (Preliminary result)   Collection Time: 11/07/19 10:28 PM   Specimen: BLOOD LEFT FOREARM  Result Value Ref Range Status   Specimen Description BLOOD LEFT FOREARM  Final   Special Requests   Final    BOTTLES DRAWN AEROBIC AND ANAEROBIC Blood Culture results may not be optimal due to an excessive volume of blood received in culture bottles   Culture   Final    NO GROWTH < 24 HOURS Performed at [facility], [address] [place], [place] [address]    Report Status PENDING  Incomplete    FURTHER DISCHARGE INSTRUCTIONS:  Get Medicines reviewed and adjusted: Please take all your medications with you for your next visit with your Primary MD  Laboratory/radiological data: Please request your Primary MD to go over all hospital tests and procedure/radiological results at the follow up, please ask your Primary MD to get all Hospital records sent to his/her office.  In some cases, they will be blood work, cultures and biopsy results pending at the time of your discharge. Please request that your primary care M.D. goes through all the records of your hospital data and follows up on these results.  Also Note the following: If you experience worsening of your admission symptoms, develop shortness of breath, life threatening emergency, suicidal or homicidal thoughts you must seek medical attention immediately by calling 911 or calling your MD immediately  if symptoms less severe.  You must read complete instructions/literature along with all the possible adverse reactions/side effects for all the Medicines you take and that have been  prescribed to you. Take any new Medicines after you have completely understood and accpet all the possible adverse reactions/side effects.   Do not drive when taking Pain medications or sleeping medications (Benzodaizepines)  Do not take more than prescribed Pain, Sleep and Anxiety Medications. It is not advisable to combine anxiety,sleep and pain medications without talking with your primary care practitioner  Special Instructions: If you have smoked or chewed Tobacco  in the last 2 yrs please stop smoking, stop any regular Alcohol  and or any Recreational drug use.  Wear Seat belts while driving.  Please note: You were cared for by a hospitalist during your hospital stay. Once you are discharged, your primary care physician will handle any further medical issues. Please note that NO REFILLS for any discharge medications will be authorized once you are discharged, as it is imperative that you return to your primary care physician (or establish a relationship with a primary care physician if you do not have one) for your post hospital discharge needs so that they can reassess your need for medications and monitor your lab values.  Total Time spent coordinating discharge including counseling, education and face to face time equals 35 minutes.  SignedJeoffrey Massed: Arielis Leonhart 11/09/2019 10:03 AM

## 2019-11-09 NOTE — Discharge Instructions (Addendum)
You are scheduled for an outpatient infusion of Remdesivir at 830 AM on Tuesday 4/20, Wednesday 4/21 and Thursday 4/22.  Please report to [facility] at [address]  Drive to the security guard and tell them you are here for an infusion. They will direct you to the front entrance where we will come and get you.  For questions call [phone].  Thanks      Person Under Monitoring Name: Brett Rhodes  Location: [address] [place] [address]   Infection Prevention Recommendations for Individuals Confirmed to have, or Being Evaluated for, 2019 Novel Coronavirus (COVID-19) Infection Who Receive Care at Home  Individuals who are confirmed to have, or are being evaluated for, COVID-19 should follow the prevention steps below until a healthcare provider or local or state health department says they can return to normal activities.  Stay home except to get medical care You should restrict activities outside your home, except for getting medical care. Do not go to work, school, or public areas, and do not use public transportation or taxis.  Call ahead before visiting your doctor Before your medical appointment, call the healthcare provider and tell them that you have, or are being evaluated for, COVID-19 infection. This will help the healthcare provider's office take steps to keep other people from getting infected. Ask your healthcare provider to call the local or state health department.  Monitor your symptoms Seek prompt medical attention if your illness is worsening (e.g., difficulty breathing). Before going to your medical appointment, call the healthcare provider and tell them that you have, or are being evaluated for, COVID-19 infection. Ask your healthcare provider to call the local or state health department.  Wear a facemask You should wear a facemask that covers your nose and mouth when you are in the same room with other people and when  you visit a healthcare provider. People who live with or visit you should also wear a facemask while they are in the same room with you.  Separate yourself from other people in your home As much as possible, you should stay in a different room from other people in your home. Also, you should use a separate bathroom, if available.  Avoid sharing household items You should not share dishes, drinking glasses, cups, eating utensils, towels, bedding, or other items with other people in your home. After using these items, you should wash them thoroughly with soap and water.  Cover your coughs and sneezes Cover your mouth and nose with a tissue when you cough or sneeze, or you can cough or sneeze into your sleeve. Throw used tissues in a lined trash can, and immediately wash your hands with soap and water for at least 20 seconds or use an alcohol-based hand rub.  Wash your [employer] your hands often and thoroughly with soap and water for at least 20 seconds. You can use an alcohol-based hand sanitizer if soap and water are not available and if your hands are not visibly dirty. Avoid touching your eyes, nose, and mouth with unwashed hands.   Prevention Steps for Caregivers and Household Members of Individuals Confirmed to have, or Being Evaluated for, COVID-19 Infection Being Cared for in the Home  If you live with, or provide care at home for, a person confirmed to have, or being evaluated for, COVID-19 infection please follow these guidelines to prevent infection:  Follow healthcare provider's instructions Make sure that you understand and can help the patient  follow any healthcare provider instructions for all care.  Provide for the patient's basic needs You should help the patient with basic needs in the home and provide support for getting groceries, prescriptions, and other personal needs.  Monitor the patient's symptoms If they are getting sicker, call his or her medical provider  and tell them that the patient has, or is being evaluated for, COVID-19 infection. This will help the healthcare provider's office take steps to keep other people from getting infected. Ask the healthcare provider to call the local or state health department.  Limit the number of people who have contact with the patient  If possible, have only one caregiver for the patient.  Other household members should stay in another home or place of residence. If this is not possible, they should stay  in another room, or be separated from the patient as much as possible. Use a separate bathroom, if available.  Restrict visitors who do not have an essential need to be in the home.  Keep older adults, very young children, and other sick people away from the patient Keep older adults, very young children, and those who have compromised immune systems or chronic health conditions away from the patient. This includes people with chronic heart, lung, or kidney conditions, diabetes, and cancer.  Ensure good ventilation Make sure that shared spaces in the home have good air flow, such as from an air conditioner or an opened window, weather permitting.  Wash your hands often  Wash your hands often and thoroughly with soap and water for at least 20 seconds. You can use an alcohol based hand sanitizer if soap and water are not available and if your hands are not visibly dirty.  Avoid touching your eyes, nose, and mouth with unwashed hands.  Use disposable paper towels to dry your hands. If not available, use dedicated cloth towels and replace them when they become wet.  Wear a facemask and gloves  Wear a disposable facemask at all times in the room and gloves when you touch or have contact with the patient's blood, body fluids, and/or secretions or excretions, such as sweat, saliva, sputum, nasal mucus, vomit, urine, or feces.  Ensure the mask fits over your nose and mouth tightly, and do not touch it  during use.  Throw out disposable facemasks and gloves after using them. Do not reuse.  Wash your hands immediately after removing your facemask and gloves.  If your personal clothing becomes contaminated, carefully remove clothing and launder. Wash your hands after handling contaminated clothing.  Place all used disposable facemasks, gloves, and other waste in a lined container before disposing them with other household waste.  Remove gloves and wash your hands immediately after handling these items.  Do not share dishes, glasses, or other household items with the patient  Avoid sharing household items. You should not share dishes, drinking glasses, cups, eating utensils, towels, bedding, or other items with a patient who is confirmed to have, or being evaluated for, COVID-19 infection.  After the person uses these items, you should wash them thoroughly with soap and water.  Wash laundry thoroughly  Immediately remove and wash clothes or bedding that have blood, body fluids, and/or secretions or excretions, such as sweat, saliva, sputum, nasal mucus, vomit, urine, or feces, on them.  Wear gloves when handling laundry from the patient.  Read and follow directions on labels of laundry or clothing items and detergent. In general, wash and dry with the warmest temperatures recommended  on the label.  Clean all areas the individual has used often  Clean all touchable surfaces, such as counters, tabletops, doorknobs, bathroom fixtures, toilets, phones, keyboards, tablets, and bedside tables, every day. Also, clean any surfaces that may have blood, body fluids, and/or secretions or excretions on them.  Wear gloves when cleaning surfaces the patient has come in contact with.  Use a diluted bleach solution (e.g., dilute bleach with 1 part bleach and 10 parts water) or a household disinfectant with a label that says EPA-registered for coronaviruses. To make a bleach solution at home, add 1  tablespoon of bleach to 1 quart (4 cups) of water. For a larger supply, add  cup of bleach to 1 gallon (16 cups) of water.  Read labels of cleaning products and follow recommendations provided on product labels. Labels contain instructions for safe and effective use of the cleaning product including precautions you should take when applying the product, such as wearing gloves or eye protection and making sure you have good ventilation during use of the product.  Remove gloves and wash hands immediately after cleaning.  Monitor yourself for signs and symptoms of illness Caregivers and household members are considered close contacts, should monitor their health, and will be asked to limit movement outside of the home to the extent possible. Follow the monitoring steps for close contacts listed on the symptom monitoring form.   ? If you have additional questions, contact your local health department or call the epidemiologist on call at [phone] (available 24/7). ? This guidance is subject to change. For the most up-to-date guidance from [facility], please refer to their website: TripMetro.hu

## 2019-11-09 NOTE — Progress Notes (Signed)
Brett Rhodes to be D/C'd Home per MD order.  Discussed with the patient and all questions fully answered.  VSS, Skin clean, dry and intact without evidence of skin break down, no evidence of skin tears noted. IV catheters discontinued intact. Site without signs and symptoms of complications. Dressing and pressure applied.  An After Visit Summary was printed and given to the patient. D/c education completed with patient including follow up instructions, medication list, d/c activities limitations if indicated, with other d/c instructions as indicated by MD - patient able to verbalize understanding, all questions fully answered.   Patient instructed to return to ED, call 911, or call MD for any changes in condition.   Patient escorted via WC, and D/C home via private auto.  Quincy Carnes 11/09/2019 12:03 PM

## 2019-11-09 NOTE — Progress Notes (Signed)
Patient scheduled for outpatient Remdesivir infusion at 830 AM on Tuesday 4/20, Wednesday 4/21 and Thursday 4/22.    Please advise them to report to [facility] at [address]  Drive to the security guard and tell them you are here for an infusion. They will direct you to the front entrance where we will come and get you.  For questions call [phone].  Thanks

## 2019-11-10 ENCOUNTER — Ambulatory Visit (HOSPITAL_COMMUNITY)
Admission: RE | Admit: 2019-11-10 | Discharge: 2019-11-10 | Disposition: A | Discharge status: Home / Self Care | Payer: BC Managed Care – PPO | Source: Ambulatory Visit | Attending: Pulmonary Disease | Admitting: Pulmonary Disease

## 2019-11-10 DIAGNOSIS — U071 COVID-19: Secondary | ICD-10-CM | POA: Diagnosis present

## 2019-11-10 MED ORDER — SODIUM CHLORIDE 0.9 % IV SOLN
100.0000 mg | Freq: Once | INTRAVENOUS | Status: AC
  Administered 2019-11-10: 08:00:00 100 mg via INTRAVENOUS
  Filled 2019-11-10: qty 20

## 2019-11-10 MED ORDER — DIPHENHYDRAMINE HCL 50 MG/ML IJ SOLN: 50.0000 mg | Freq: Once | INTRAMUSCULAR | Status: DC | PRN

## 2019-11-10 MED ORDER — METHYLPREDNISOLONE SODIUM SUCC 125 MG IJ SOLR: 125.0000 mg | Freq: Once | INTRAMUSCULAR | Status: DC | PRN

## 2019-11-10 MED ORDER — SODIUM CHLORIDE 0.9 % IV SOLN: INTRAVENOUS | Status: DC | PRN

## 2019-11-10 MED ORDER — FAMOTIDINE IN NACL 20-0.9 MG/50ML-% IV SOLN: 20.0000 mg | Freq: Once | INTRAVENOUS | Status: DC | PRN

## 2019-11-10 MED ORDER — EPINEPHRINE 0.3 MG/0.3ML IJ SOAJ: 0.3000 mg | Freq: Once | INTRAMUSCULAR | Status: DC | PRN

## 2019-11-10 MED ORDER — ALBUTEROL SULFATE HFA 108 (90 BASE) MCG/ACT IN AERS: 2.0000 | INHALATION_SPRAY | Freq: Once | RESPIRATORY_TRACT | Status: DC | PRN

## 2019-11-10 NOTE — Progress Notes (Signed)
  Diagnosis: COVID-19  Physician: Dr. Delford Field  Procedure: [facility] Med: remdesivir infusion - Provided patient with remdesivir fact sheet for patients, parents and caregivers prior to infusion.  Complications: No immediate complications noted.  Discharge: Discharged home   Essie Hart 11/10/2019

## 2019-11-11 ENCOUNTER — Ambulatory Visit (HOSPITAL_COMMUNITY)
Admit: 2019-11-11 | Discharge: 2019-11-11 | Disposition: A | Discharge status: Home / Self Care | Payer: BC Managed Care – PPO | Attending: Pulmonary Disease | Admitting: Pulmonary Disease

## 2019-11-11 DIAGNOSIS — U071 COVID-19: Secondary | ICD-10-CM | POA: Diagnosis not present

## 2019-11-11 MED ORDER — METHYLPREDNISOLONE SODIUM SUCC 125 MG IJ SOLR: 125.0000 mg | Freq: Once | INTRAMUSCULAR | Status: DC | PRN

## 2019-11-11 MED ORDER — EPINEPHRINE 0.3 MG/0.3ML IJ SOAJ: 0.3000 mg | Freq: Once | INTRAMUSCULAR | Status: DC | PRN

## 2019-11-11 MED ORDER — SODIUM CHLORIDE 0.9 % IV SOLN: INTRAVENOUS | Status: DC | PRN

## 2019-11-11 MED ORDER — SODIUM CHLORIDE 0.9 % IV SOLN
100.0000 mg | Freq: Once | INTRAVENOUS | Status: AC
  Administered 2019-11-11: 100 mg via INTRAVENOUS
  Filled 2019-11-11: qty 20

## 2019-11-11 MED ORDER — DIPHENHYDRAMINE HCL 50 MG/ML IJ SOLN: 50.0000 mg | Freq: Once | INTRAMUSCULAR | Status: DC | PRN

## 2019-11-11 MED ORDER — ALBUTEROL SULFATE HFA 108 (90 BASE) MCG/ACT IN AERS: 2.0000 | INHALATION_SPRAY | Freq: Once | RESPIRATORY_TRACT | Status: DC | PRN

## 2019-11-11 MED ORDER — FAMOTIDINE IN NACL 20-0.9 MG/50ML-% IV SOLN: 20.0000 mg | Freq: Once | INTRAVENOUS | Status: DC | PRN

## 2019-11-11 NOTE — Progress Notes (Signed)
  Diagnosis: COVID-19  Physician:Dr wright  Procedure: [facility] Med: remdesivir infusion - Provided patient with remdesivir fact sheet for patients, parents and caregivers prior to infusion.  Complications: No immediate complications noted.  Discharge: Discharged home   Brett Rhodes 11/11/2019

## 2019-11-12 ENCOUNTER — Ambulatory Visit (HOSPITAL_COMMUNITY)
Admit: 2019-11-12 | Discharge: 2019-11-12 | Disposition: A | Discharge status: Home / Self Care | Payer: BC Managed Care – PPO | Attending: Pulmonary Disease | Admitting: Pulmonary Disease

## 2019-11-12 DIAGNOSIS — U071 COVID-19: Secondary | ICD-10-CM | POA: Diagnosis not present

## 2019-11-12 LAB — CULTURE, BLOOD (ROUTINE X 2): Culture: NO GROWTH

## 2019-11-12 MED ORDER — FAMOTIDINE IN NACL 20-0.9 MG/50ML-% IV SOLN: 20.0000 mg | Freq: Once | INTRAVENOUS | Status: DC | PRN

## 2019-11-12 MED ORDER — DIPHENHYDRAMINE HCL 50 MG/ML IJ SOLN: 50.0000 mg | Freq: Once | INTRAMUSCULAR | Status: DC | PRN

## 2019-11-12 MED ORDER — EPINEPHRINE 0.3 MG/0.3ML IJ SOAJ: 0.3000 mg | Freq: Once | INTRAMUSCULAR | Status: DC | PRN

## 2019-11-12 MED ORDER — METHYLPREDNISOLONE SODIUM SUCC 125 MG IJ SOLR: 125.0000 mg | Freq: Once | INTRAMUSCULAR | Status: DC | PRN

## 2019-11-12 MED ORDER — SODIUM CHLORIDE 0.9 % IV SOLN
100.0000 mg | Freq: Once | INTRAVENOUS | Status: AC
  Administered 2019-11-12: 08:00:00 100 mg via INTRAVENOUS
  Filled 2019-11-12: qty 20

## 2019-11-12 MED ORDER — ALBUTEROL SULFATE HFA 108 (90 BASE) MCG/ACT IN AERS: 2.0000 | INHALATION_SPRAY | Freq: Once | RESPIRATORY_TRACT | Status: DC | PRN

## 2019-11-12 MED ORDER — SODIUM CHLORIDE 0.9 % IV SOLN: INTRAVENOUS | Status: DC | PRN

## 2019-11-12 NOTE — Progress Notes (Signed)
  Diagnosis: COVID-19  Physician: Dr. Lovie Macadamia  Procedure: [facility] Med: remdesivir infusion - Provided patient with remdesivir fact sheet for patients, parents and caregivers prior to infusion.  Complications: No immediate complications noted.  Discharge: Discharged home   Essie Hart 11/12/2019

## 2021-09-26 IMAGING — CT CT ANGIO CHEST
2 of 6 series · 19 of 46 positions shown · IV contrast (omnipaque)
Comparison: Chest radiograph from earlier today.

CLINICAL DATA: Chest pain and dyspnea. Abnormal chest radiograph.

EXAM:
CT ANGIOGRAPHY CHEST WITH CONTRAST
TECHNIQUE: Multidetector CT imaging of the chest was performed using the
standard protocol during bolus administration of intravenous
contrast. Multiplanar CT image reconstructions and MIPs were
obtained to evaluate the vascular anatomy.
CONTRAST:  100mL OMNIPAQUE IOHEXOL 350 MG/ML SOLN

[Series 6: thins · axial · 0.83mm/px · z∈[-172,+83]mm · 16 of 281 slices shown]
[im 13/281  lung]
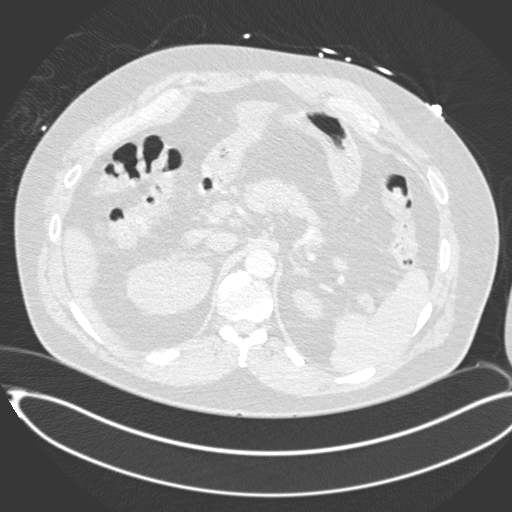
[im 37/281  soft-tissue]
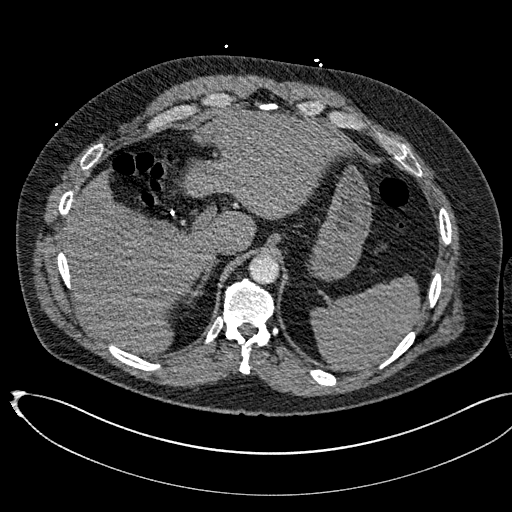
[im 49/281  lung]
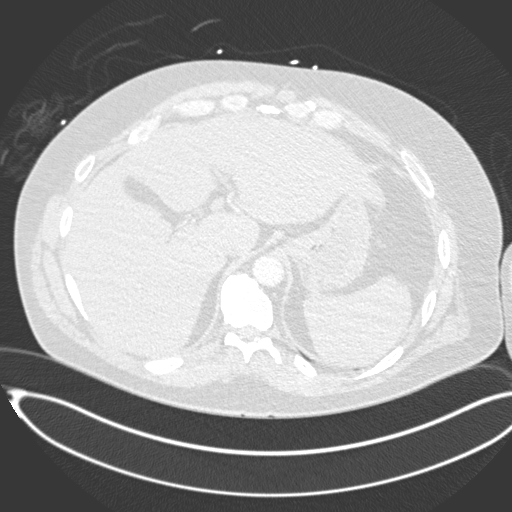
[im 61/281  soft-tissue]
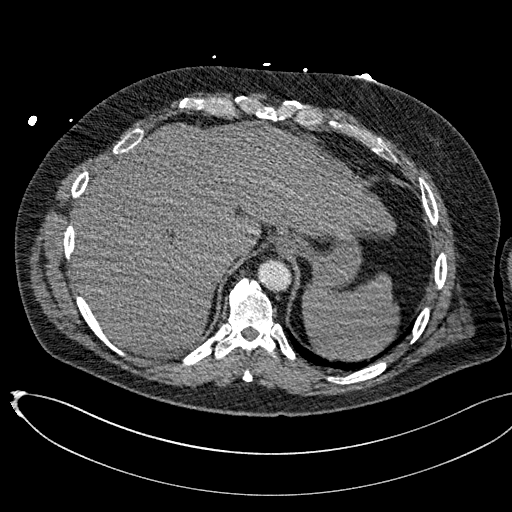
[im 86/281  lung]
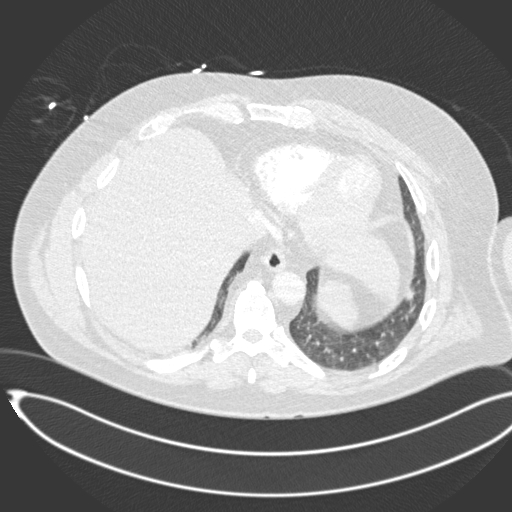
[im 98/281  soft-tissue]
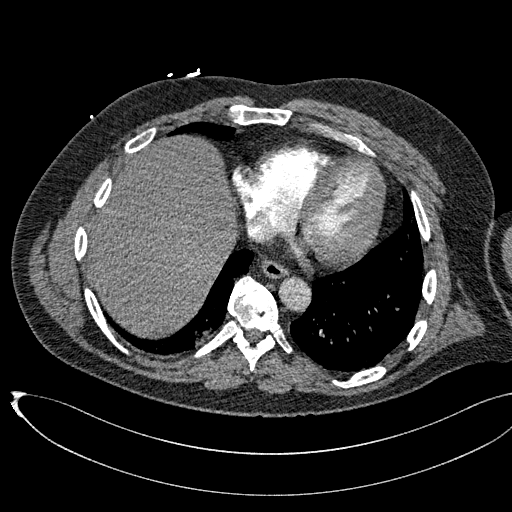
[im 110/281  lung]
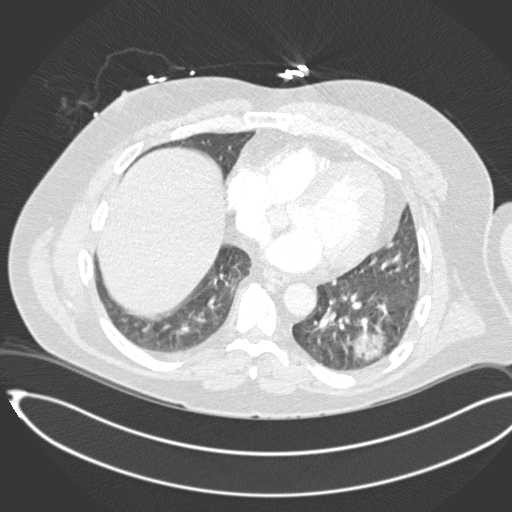
[im 134/281  soft-tissue]
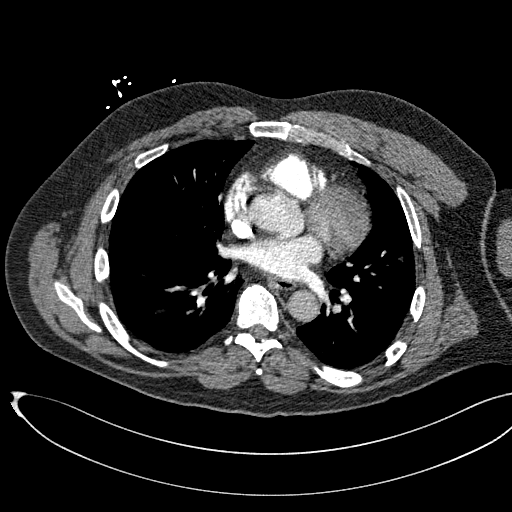
[im 147/281  lung]
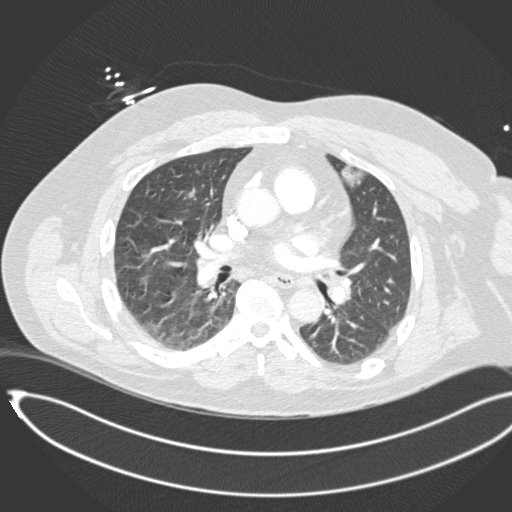
[im 171/281  soft-tissue]
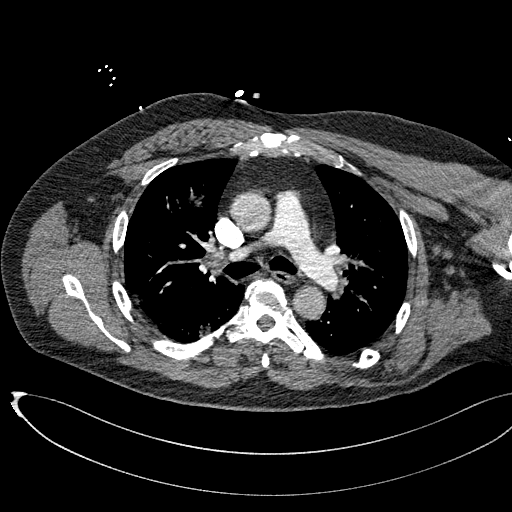
[im 183/281  lung]
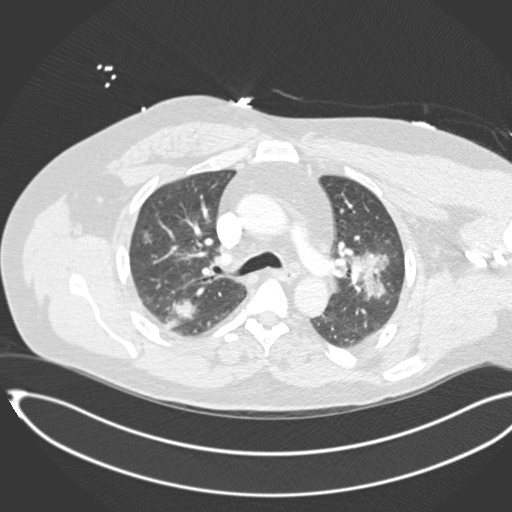
[im 195/281  soft-tissue]
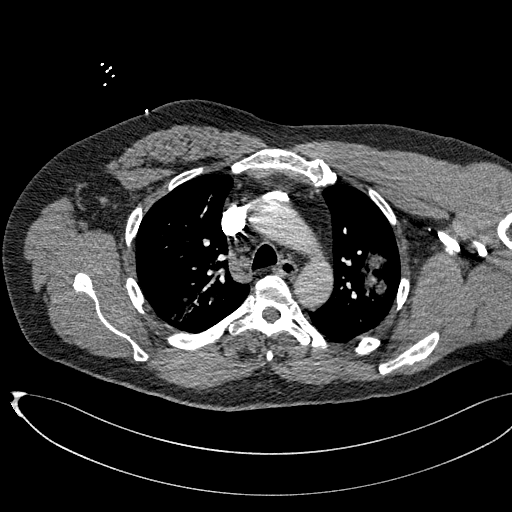
[im 220/281  lung]
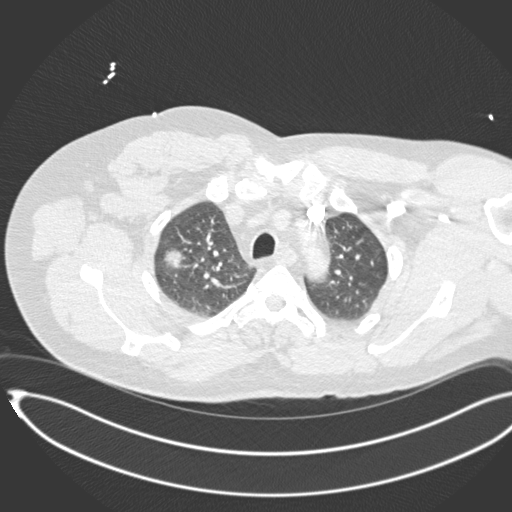
[im 232/281  soft-tissue]
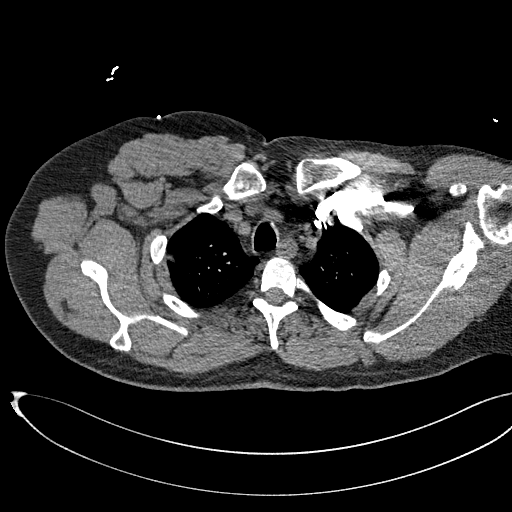
[im 244/281  lung]
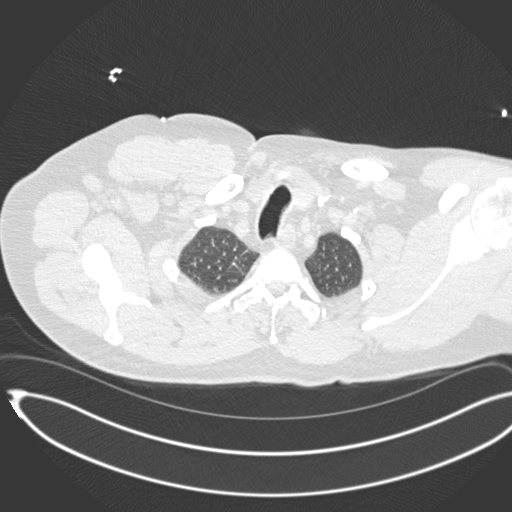
[im 268/281  soft-tissue]
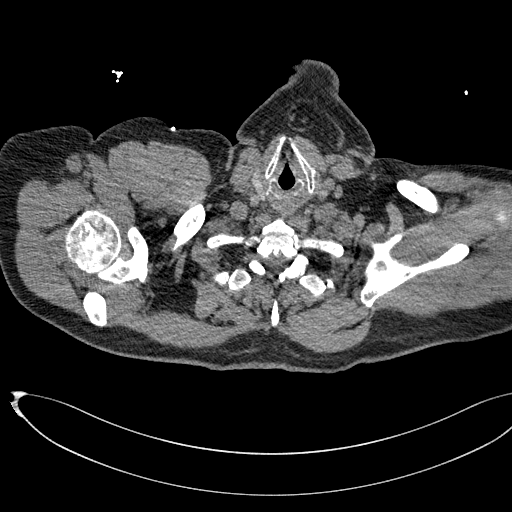

[Series 8: coronal mpr · coronal · 0.59mm/px · 3 of 151 slices shown]
[im 38/151  soft-tissue]
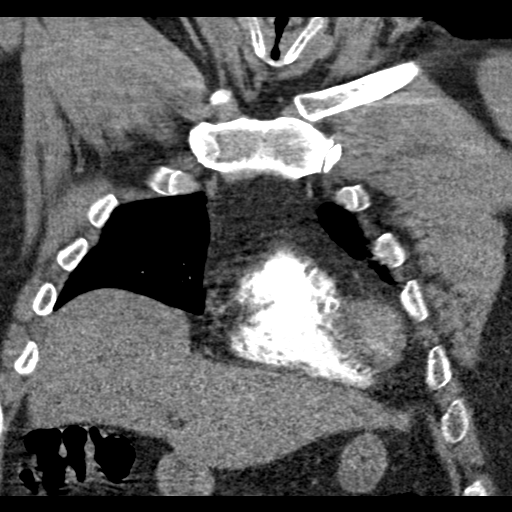
[im 76/151  soft-tissue]
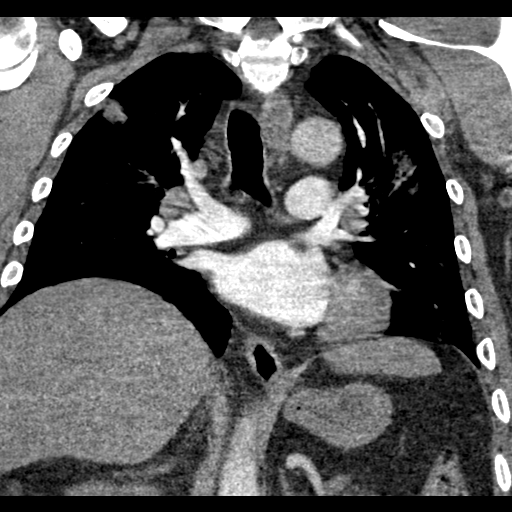
[im 113/151  soft-tissue]
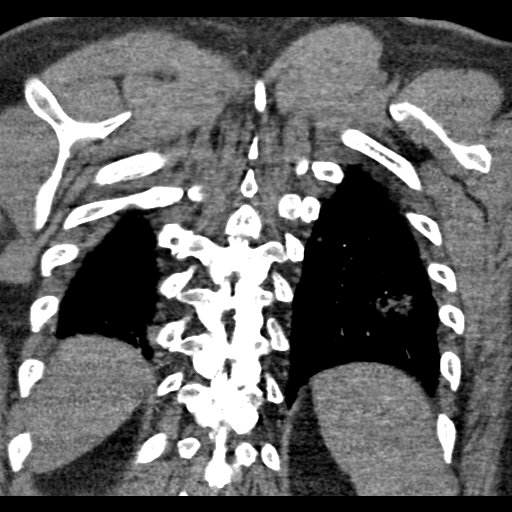

[19 of 46 positions shown; findings below may reference images not displayed]

FINDINGS: Cardiovascular: The study is moderate quality for the evaluation of
pulmonary embolism, with mild motion degradation and suboptimal
bolus opacification. There are no filling defects in the central,
lobar, segmental or subsegmental pulmonary artery branches to
suggest acute pulmonary embolism. Great vessels are normal in course
and caliber. Normal heart size. No significant pericardial
fluid/thickening.

Mediastinum/Nodes: No discrete thyroid nodules. Unremarkable
esophagus. No pathologically enlarged axillary, mediastinal or hilar
lymph nodes.

Lungs/Pleura: No pneumothorax. No pleural effusion. Widespread
patchy poorly marginated consolidative and ground-glass nodules
throughout both lungs, for example measuring 2.8 cm in the left
lower lobe (series 7/image 58), 1.9 cm in the apical right upper
lobe (series 7/image 20) and 3.8 cm in the left upper lobe (series
7/image 33).

Upper abdomen: No acute abnormality.

Musculoskeletal: No aggressive appearing focal osseous lesions.
Moderate thoracic spondylosis.

Review of the MIP images confirms the above findings.
IMPRESSION: 1. No evidence of pulmonary embolism.
2. Widespread patchy poorly marginated consolidative and
ground-glass nodules throughout both lungs. Atypical pneumonia
suspected, with differential including [id number] pneumonia or
cryptogenic organizing pneumonia. Recommend follow-up chest CT in 3
months to document resolution / exclude neoplasm.

## 2021-09-26 IMAGING — CR DG CHEST 2V
2 series · 2 of 2 positions shown · non-contrast
Comparison: None.

CLINICAL DATA: Shortness of breath.

EXAM:
CHEST - 2 VIEW

[chest pa]
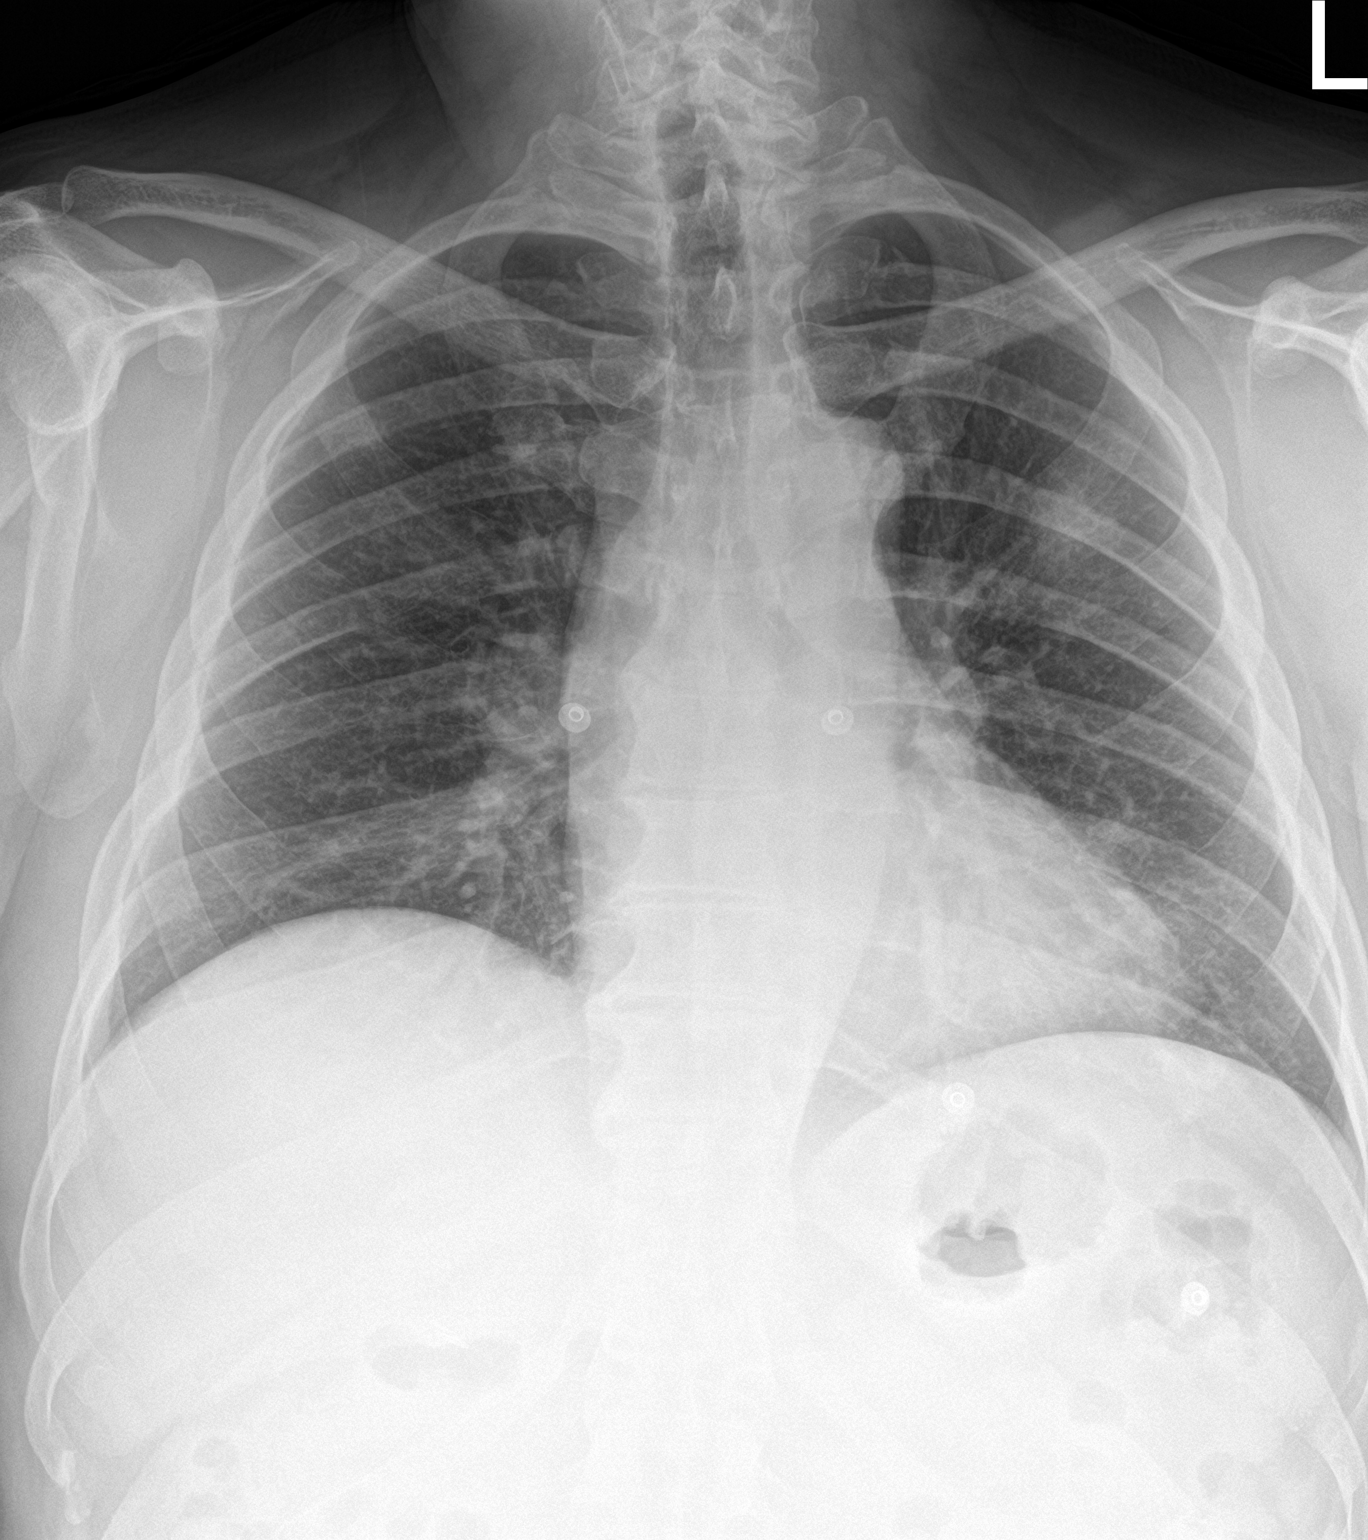

[chest lat]
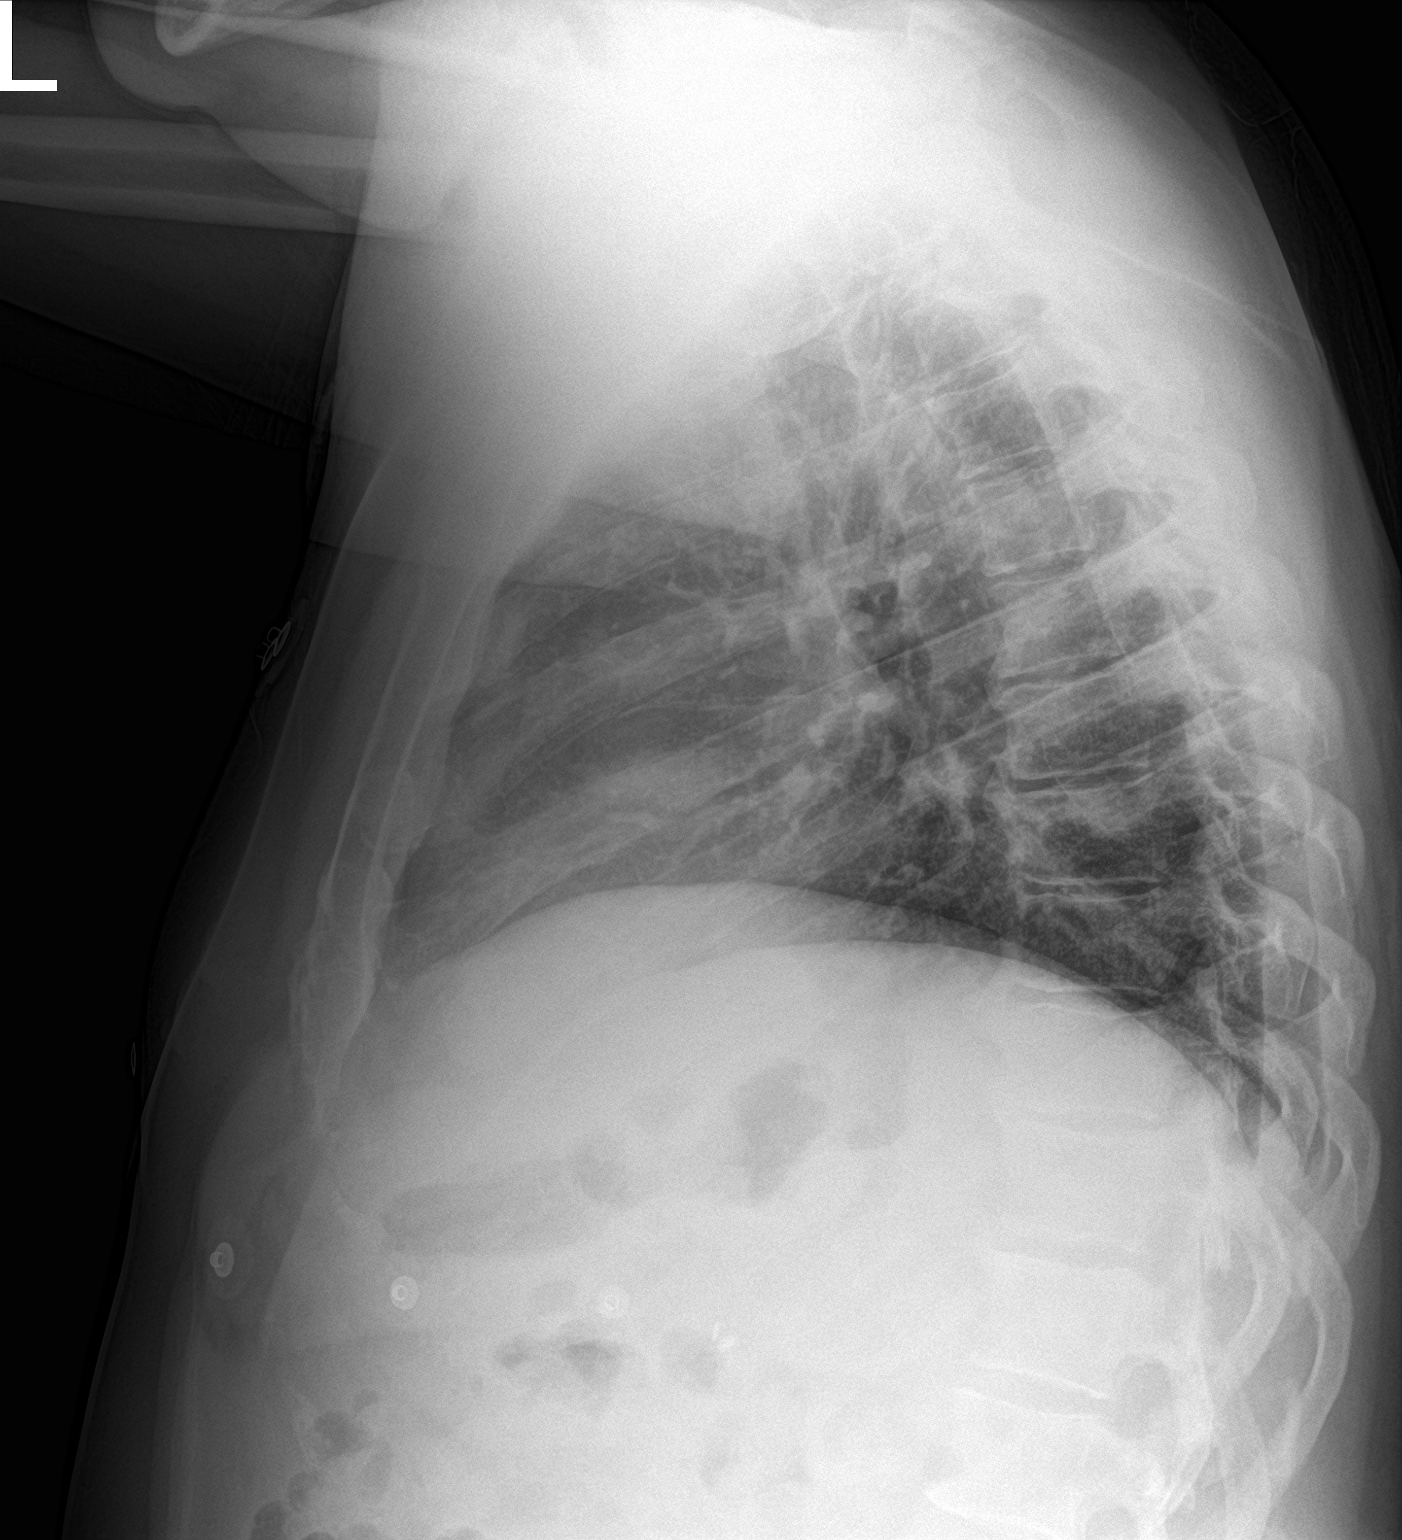

[2 of 2 positions shown; findings below may reference images not displayed]

FINDINGS: The heart size and mediastinal contours are within normal limits. No
pneumothorax or pleural effusion is noted. Right lung is clear.
Ill-defined left upper lobe density is noted which may represent
focal inflammation, but neoplasm malignancy cannot be excluded. The
visualized skeletal structures are unremarkable.
IMPRESSION: Ill-defined left upper lobe density is noted which may represent
focal inflammation, but neoplasm cannot be excluded. CT scan of the
chest is recommended for further evaluation.

## 2022-09-10 ENCOUNTER — Ambulatory Visit (INDEPENDENT_AMBULATORY_CARE_PROVIDER_SITE_OTHER): Payer: BC Managed Care – PPO

## 2022-09-10 ENCOUNTER — Ambulatory Visit
Admission: EM | Admit: 2022-09-10 | Discharge: 2022-09-10 | Disposition: A | Discharge status: Home / Self Care | Payer: BC Managed Care – PPO | Attending: Internal Medicine | Admitting: Internal Medicine

## 2022-09-10 DIAGNOSIS — J069 Acute upper respiratory infection, unspecified: Secondary | ICD-10-CM

## 2022-09-10 DIAGNOSIS — U071 COVID-19: Secondary | ICD-10-CM | POA: Diagnosis not present

## 2022-09-10 DIAGNOSIS — R059 Cough, unspecified: Secondary | ICD-10-CM

## 2022-09-10 HISTORY — DX: Hyperlipidemia, unspecified: E78.5

## 2022-09-10 MED ORDER — GUAIFENESIN 200 MG PO TABS: 200.0000 mg | ORAL_TABLET | ORAL | 0 refills | Status: AC | PRN

## 2022-09-10 NOTE — ED Triage Notes (Signed)
Pt c/o sinus congestion, deep cough, sore throat,   Onset ~ 2 days ago

## 2022-09-10 NOTE — ED Provider Notes (Signed)
[facility]    CSN: [id number] Arrival date & time: 09/10/22  1138      History   Chief Complaint Chief Complaint  Patient presents with   Cough    HPI Brett Rhodes is a 62 y.o. male.   Patient presents with 5-day history of nasal congestion and cough.  Cough is nonproductive per patient.  Low-grade temp at home of 99.  Denies any known sick contacts.  Denies chest pain, shortness of breath, nausea, vomiting diarrhea, abdominal pain.  Denies history of asthma or COPD and patient does not smoke cigarettes.  Patient does report history of wearing a BiPAP nightly.   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 Authorizing Provider  atorvastatin (LIPITOR) 20 MG tablet  11/27/21  Yes [provider]  guaiFENesin 200 MG tablet Take 1 tablet (200 mg total) by mouth every 4 (four) hours as needed for cough or to loosen phlegm. 09/10/22  Yes Duyen Beckom, Michele Rockers, FNP  albuterol (VENTOLIN HFA) 108 (90 Base) MCG/ACT inhaler Inhale 2 puffs into the lungs every 4 (four) hours as needed for wheezing or shortness of breath. 11/09/19   Ghimire, Henreitta Leber, MD  benzonatate (TESSALON) 200 MG capsule Take 1 capsule (200 mg total) by mouth 3 (three) times daily as needed for cough. 11/09/19   Ghimire, Henreitta Leber, MD  divalproex (DEPAKOTE ER) 500 MG 24 hr tablet Take 1,000 mg by mouth at bedtime.     [provider]  ibuprofen (ADVIL) 200 MG tablet Take 400 mg by mouth every 6 (six) hours as needed for mild pain.    [provider]  Multiple Vitamin  (MULTIVITAMIN WITH MINERALS) TABS tablet Take 1 tablet by mouth daily.    [provider]    Family History Family History  Problem Relation Age of Onset   Hypertension Mother    Hyperlipidemia Mother    Hyperlipidemia Father     Social History Social History   Tobacco Use   Smoking status: Former   Smokeless tobacco: Never  Substance Use Topics   Alcohol use: Not Currently   Drug use: Not Currently     Allergies   Prednisone   Review of Systems Review of Systems Per HPI  Physical Exam Triage Vital Signs ED Triage Vitals [09/10/22 1250]  Enc Vitals Group     BP (!) 146/85     Pulse Rate 87     Resp 16     Temp 97.8 F (36.6 C)     Temp Source Oral     SpO2 97 %     Weight      Height      Head Circumference      Peak Flow      Pain Score 3     Pain Loc      Pain Edu?      Excl. in [place]?    No data found.  Updated Vital Signs BP (!) 146/85 (BP Location: Left Arm)  Pulse 87   Temp 97.8 F (36.6 C) (Oral)   Resp 16   SpO2 97%   Visual Acuity Right Eye Distance:   Left Eye Distance:   Bilateral Distance:    Right Eye Near:   Left Eye Near:    Bilateral Near:     Physical Exam Constitutional:      General: He is not in acute distress.    Appearance: Normal appearance. He is not toxic-appearing or diaphoretic.  HENT:     Head: Normocephalic and atraumatic.     Right Ear: Tympanic membrane and ear canal normal.     Left Ear: Tympanic membrane and ear canal normal.     Nose: Congestion present.     Mouth/Throat:     Mouth: Mucous membranes are moist.     Pharynx: No posterior oropharyngeal erythema.  Eyes:     Extraocular Movements: Extraocular movements intact.     Conjunctiva/sclera: Conjunctivae normal.     Pupils: Pupils are equal, round, and reactive to light.  Cardiovascular:     Rate and Rhythm: Normal rate and regular rhythm.     Pulses: Normal pulses.     Heart sounds: Normal heart sounds.  Pulmonary:     Effort:  Pulmonary effort is normal. No respiratory distress.     Breath sounds: No stridor. Rhonchi present. No wheezing or rales.     Comments: Mild rhonchi in right lower lung base to auscultation. Abdominal:     General: Abdomen is flat. Bowel sounds are normal.     Palpations: Abdomen is soft.  Musculoskeletal:        General: Normal range of motion.     Cervical back: Normal range of motion.  Skin:    General: Skin is warm and dry.  Neurological:     General: No focal deficit present.     Mental Status: He is alert and oriented to person, place, and time. Mental status is at baseline.  Psychiatric:        Mood and Affect: Mood normal.        Behavior: Behavior normal.      UC Treatments / Results  Labs (all labs ordered are listed, but only abnormal results are displayed) Labs Reviewed  SARS CORONAVIRUS 2 (TAT 6-24 HRS)    EKG   Radiology DG Chest 2 View  Result Date: 09/10/2022 CLINICAL DATA:  Cough, sinus congestion, sore throat. EXAM: CHEST - 2 VIEW COMPARISON:  07/14/2021 and CT chest 02/12/2020. FINDINGS: Trachea is midline. Heart size normal. Lungs are clear. No pleural fluid. Flowing anterior osteophytosis in the thoracic spine. IMPRESSION: No acute findings. Electronically Signed   By: Lorin Picket M.D.   On: 09/10/2022 13:19    Procedures Procedures (including critical care time)  Medications Ordered in UC Medications - No data to display  Initial Impression / Assessment and Plan / UC Course  I have reviewed the triage vital signs and the nursing notes.  Pertinent labs & imaging results that were available during my care of the patient were reviewed by me and considered in my medical decision making (see chart for details).     Patient presents with symptoms likely from a viral upper respiratory infection. Do not suspect underlying cardiopulmonary process. Symptoms seem unlikely related to ACS, CHF or COPD exacerbations, pneumonia, pneumothorax. Patient is  nontoxic appearing and not in need of emergent medical intervention.  Chest x-ray was negative for any acute cardiopulmonary process. Covid test pending.   Recommended symptom  control with medications and supportive care.  Patient was sent prescription.  Return if symptoms fail to improve in 1-2 weeks or you develop shortness of breath, chest pain, severe headache. Patient states understanding and is agreeable.  Discharged with PCP followup.  Final Clinical Impressions(s) / UC Diagnoses   Final diagnoses:  Viral upper respiratory tract infection with cough     Discharge Instructions      Chest x-ray was normal.  It appears that you have a viral illness that should run its course and self resolve with the help of symptomatic treatment.  Please follow-up if any symptoms persist or worsen.     ED Prescriptions     Medication Sig Dispense Auth. Provider   guaiFENesin 200 MG tablet Take 1 tablet (200 mg total) by mouth every 4 (four) hours as needed for cough or to loosen phlegm. 30 tablet [place], Michele Rockers, [place]      PDMP not reviewed this encounter.   Teodora Medici, [place] 09/10/22 1334

## 2022-09-10 NOTE — Discharge Instructions (Signed)
Chest x-ray was normal.  It appears that you have a viral illness that should run its course and self resolve with the help of symptomatic treatment.  Please follow-up if any symptoms persist or worsen.

## 2022-09-11 LAB — SARS CORONAVIRUS 2 (TAT 6-24 HRS): SARS Coronavirus 2: POSITIVE — AB

## 2023-01-16 ENCOUNTER — Other Ambulatory Visit: Payer: Self-pay

## 2023-01-16 DIAGNOSIS — M25511 Pain in right shoulder: Secondary | ICD-10-CM

## 2023-01-17 ENCOUNTER — Other Ambulatory Visit: Payer: Self-pay | Admitting: Orthopedic Surgery

## 2023-01-17 DIAGNOSIS — M25511 Pain in right shoulder: Secondary | ICD-10-CM

## 2023-02-03 ENCOUNTER — Ambulatory Visit
Admission: RE | Admit: 2023-02-03 | Discharge: 2023-02-03 | Disposition: A | Discharge status: Home / Self Care | Payer: BC Managed Care – PPO | Source: Ambulatory Visit | Attending: Orthopedic Surgery | Admitting: Orthopedic Surgery

## 2023-02-03 DIAGNOSIS — M25511 Pain in right shoulder: Secondary | ICD-10-CM

## 2023-02-07 ENCOUNTER — Encounter: Payer: Self-pay | Admitting: Chiropractic Medicine

## 2023-11-08 ENCOUNTER — Other Ambulatory Visit: Payer: Self-pay | Admitting: Sports Medicine

## 2023-11-08 DIAGNOSIS — M25572 Pain in left ankle and joints of left foot: Secondary | ICD-10-CM

## 2023-11-22 ENCOUNTER — Ambulatory Visit
Admission: RE | Admit: 2023-11-22 | Discharge: 2023-11-22 | Disposition: A | Discharge status: Home / Self Care | Source: Ambulatory Visit | Attending: Sports Medicine | Admitting: Sports Medicine

## 2023-11-22 DIAGNOSIS — M25572 Pain in left ankle and joints of left foot: Secondary | ICD-10-CM
